# Patient Record
Sex: Male | Born: 1985 | Race: White | Hispanic: No | Marital: Married | Smoking: Never smoker
Health system: Southern US, Community
[De-identification: ages and names within clinical notes are randomized; demographics above are authoritative.]

## PROBLEM LIST (undated history)

## (undated) DIAGNOSIS — Z801 Family history of malignant neoplasm of trachea, bronchus and lung: Secondary | ICD-10-CM

## (undated) HISTORY — PX: WISDOM TOOTH EXTRACTION: SHX21

## (undated) HISTORY — DX: Family history of malignant neoplasm of trachea, bronchus and lung: Z80.1

---

## 2019-11-27 ENCOUNTER — Ambulatory Visit: Payer: Self-pay | Attending: Internal Medicine

## 2019-11-27 DIAGNOSIS — Z23 Encounter for immunization: Secondary | ICD-10-CM | POA: Insufficient documentation

## 2019-11-27 NOTE — Progress Notes (Signed)
   Covid-19 Vaccination Clinic  Name:  Henry Wall    MRN: [id number] DOB: 1985-12-24  11/27/2019  [name] was observed post Covid-19 immunization for 15 minutes without incident. He was provided with Vaccine Information Sheet and instruction to access the V-Safe system.   [name] was instructed to call 911 with any severe reactions post vaccine: Marland Kitchen Difficulty breathing  . Swelling of face and throat  . A fast heartbeat  . A bad rash all over body  . Dizziness and weakness

## 2019-12-29 ENCOUNTER — Ambulatory Visit: Payer: Self-pay | Attending: Internal Medicine

## 2019-12-29 DIAGNOSIS — Z23 Encounter for immunization: Secondary | ICD-10-CM

## 2019-12-29 NOTE — Progress Notes (Signed)
   Covid-19 Vaccination Clinic  Name:  Henry Wall    MRN: [id number] DOB: [birth year]  12/29/2019  [name] was observed post Covid-19 immunization for 15 minutes without incident. He was provided with Vaccine Information Sheet and instruction to access the V-Safe system.   [name] was instructed to call 911 with any severe reactions post vaccine: Marland Kitchen Difficulty breathing  . Swelling of face and throat  . A fast heartbeat  . A bad rash all over body  . Dizziness and weakness   Immunizations Administered    Name Date Dose VIS Date Route   Pfizer COVID-19 Vaccine 12/29/2019  9:55 AM 0.3 mL 09/04/2019 Intramuscular   Manufacturer: [employer]   Lot: [id number]   Blunt: [id number]

## 2020-10-18 ENCOUNTER — Ambulatory Visit: Payer: Federal, State, Local not specified - PPO | Admitting: Internal Medicine

## 2020-10-18 ENCOUNTER — Encounter: Payer: Self-pay | Admitting: Internal Medicine

## 2020-10-18 ENCOUNTER — Other Ambulatory Visit: Payer: Self-pay

## 2020-10-18 VITALS — BP 116/76 | HR 70 | Temp 98.9°F | Resp 16 | Ht 69.0 in | Wt 169.0 lb

## 2020-10-18 DIAGNOSIS — E559 Vitamin D deficiency, unspecified: Secondary | ICD-10-CM | POA: Diagnosis not present

## 2020-10-18 DIAGNOSIS — R5383 Other fatigue: Secondary | ICD-10-CM

## 2020-10-18 DIAGNOSIS — Z23 Encounter for immunization: Secondary | ICD-10-CM | POA: Diagnosis not present

## 2020-10-18 DIAGNOSIS — Z0000 Encounter for general adult medical examination without abnormal findings: Secondary | ICD-10-CM

## 2020-10-18 LAB — CBC WITH DIFFERENTIAL/PLATELET
Basophils Absolute: 0 10*3/uL (ref 0.0–0.1)
Basophils Relative: 0.9 % (ref 0.0–3.0)
Eosinophils Absolute: 0.1 10*3/uL (ref 0.0–0.7)
Eosinophils Relative: 1.4 % (ref 0.0–5.0)
HCT: 43.2 % (ref 39.0–52.0)
Hemoglobin: 14.7 g/dL (ref 13.0–17.0)
Lymphocytes Relative: 30.1 % (ref 12.0–46.0)
Lymphs Abs: 1.3 10*3/uL (ref 0.7–4.0)
MCHC: 34 g/dL (ref 30.0–36.0)
MCV: 94.7 fl (ref 78.0–100.0)
Monocytes Absolute: 0.3 10*3/uL (ref 0.1–1.0)
Monocytes Relative: 7.1 % (ref 3.0–12.0)
Neutro Abs: 2.6 10*3/uL (ref 1.4–7.7)
Neutrophils Relative %: 60.5 % (ref 43.0–77.0)
Platelets: 159 10*3/uL (ref 150.0–400.0)
RBC: 4.56 Mil/uL (ref 4.22–5.81)
RDW: 13.2 % (ref 11.5–15.5)
WBC: 4.3 10*3/uL (ref 4.0–10.5)

## 2020-10-18 LAB — HEPATIC FUNCTION PANEL
ALT: 14 U/L (ref 0–53)
AST: 17 U/L (ref 0–37)
Albumin: 4.9 g/dL (ref 3.5–5.2)
Alkaline Phosphatase: 53 U/L (ref 39–117)
Bilirubin, Direct: 0.2 mg/dL (ref 0.0–0.3)
Total Bilirubin: 1.2 mg/dL (ref 0.2–1.2)
Total Protein: 7.1 g/dL (ref 6.0–8.3)

## 2020-10-18 LAB — BASIC METABOLIC PANEL
BUN: 10 mg/dL (ref 6–23)
CO2: 29 mEq/L (ref 19–32)
Calcium: 9.5 mg/dL (ref 8.4–10.5)
Chloride: 105 mEq/L (ref 96–112)
Creatinine, Ser: 0.89 mg/dL (ref 0.40–1.50)
GFR: 110.37 mL/min (ref >60.00)
Glucose, Bld: 85 mg/dL (ref 70–99)
Potassium: 3.9 mEq/L (ref 3.5–5.1)
Sodium: 139 mEq/L (ref 135–145)

## 2020-10-18 LAB — TSH: TSH: 1.28 u[IU]/mL (ref 0.35–4.50)

## 2020-10-18 LAB — LIPID PANEL
Cholesterol: 176 mg/dL (ref 0–200)
HDL: 74.8 mg/dL (ref >39.00)
LDL Cholesterol: 93 mg/dL (ref 0–99)
NonHDL: 101.52
Total CHOL/HDL Ratio: 2
Triglycerides: 45 mg/dL (ref 0.0–149.0)
VLDL: 9 mg/dL (ref 0.0–40.0)

## 2020-10-18 LAB — VITAMIN D 25 HYDROXY (VIT D DEFICIENCY, FRACTURES): VITD: 16.15 ng/mL — ABNORMAL LOW (ref 30.00–100.00)

## 2020-10-18 MED ORDER — CHOLECALCIFEROL 1.25 MG (50000 UT) PO CAPS
50000.0000 [IU] | ORAL_CAPSULE | ORAL | 1 refills | Status: AC
Start: 2020-10-18 — End: ?

## 2020-10-18 NOTE — Patient Instructions (Signed)

## 2020-10-18 NOTE — Progress Notes (Signed)
Subjective:  Patient ID: Henry Wall, male    DOB: 1986-06-03  Age: 35 y.o. MRN: [id number]  CC: Annual Exam  This visit occurred during the SARS-CoV-2 public health emergency.  Safety protocols were in place, including screening questions prior to the visit, additional usage of staff PPE, and extensive cleaning of exam room while observing appropriate contact time as indicated for disinfecting solutions.   NEW TO ME  HPI Henry Wall presents for a CPX.  He complains of a 55-month history of fatigue that occurs only at rest.  He exercises quite a bit and does not experience CP, DOE, palpitations, edema, or fatigue.  He has mild insomnia but takes melatonin and gets 6 to 7 hours of sleep per night.  There is no history of snoring or sleep apnea.  History Henry Wall has no past medical history on file.   He has a past surgical history that includes Wisdom tooth extraction.   His family history includes Atrial fibrillation in his father; Depression in his sister; Hypertension in his mother; Schizophrenia in his brother.He reports that he has never smoked. He has never used smokeless tobacco. He reports current alcohol use of about 12.0 standard drinks of alcohol per week. He reports that he does not use drugs.  No outpatient medications prior to visit.   No facility-administered medications prior to visit.    ROS Review of Systems  Constitutional: Positive for fatigue. Negative for appetite change, chills, diaphoresis, fever and unexpected weight change.  Eyes: Negative.   Respiratory: Negative for apnea, cough, chest tightness, shortness of breath and wheezing.   Cardiovascular: Negative for chest pain, palpitations and leg swelling.  Gastrointestinal: Negative for abdominal pain, constipation, diarrhea, nausea and vomiting.  Endocrine: Negative.  Negative for cold intolerance and heat intolerance.  Genitourinary: Negative.  Negative for difficulty urinating, dysuria, scrotal swelling and  testicular pain.  Musculoskeletal: Negative for arthralgias, joint swelling and myalgias.  Skin: Negative.  Negative for color change.  Neurological: Negative.  Negative for dizziness, weakness, light-headedness and headaches.  Hematological: Negative for adenopathy. Does not bruise/bleed easily.  Psychiatric/Behavioral: Negative.     Objective:  BP 116/76   Pulse 70   Temp 98.9 F (37.2 C) (Oral)   Resp 16   Ht 5\' 9"  (1.753 m)   Wt 169 lb (76.7 kg)   SpO2 97%   BMI 24.96 kg/m   Physical Exam Vitals reviewed.  Constitutional:      Appearance: Normal appearance.  HENT:     Nose: Nose normal.     Mouth/Throat:     Mouth: Mucous membranes are moist.  Eyes:     General: No scleral icterus.    Conjunctiva/sclera: Conjunctivae normal.  Cardiovascular:     Rate and Rhythm: Normal rate and regular rhythm.     Heart sounds: No murmur heard.     Comments: EKG- NSR, 64 Normal EKG Pulmonary:     Effort: Pulmonary effort is normal.     Breath sounds: No stridor. No wheezing, rhonchi or rales.  Abdominal:     General: Abdomen is flat. Bowel sounds are normal. There is no distension.     Palpations: Abdomen is soft. There is no hepatomegaly, splenomegaly or mass.     Tenderness: There is no abdominal tenderness.  Musculoskeletal:        General: Normal range of motion.     Cervical back: Neck supple.     Right lower leg: No edema.     Left lower leg:  No edema.  Lymphadenopathy:     Cervical: No cervical adenopathy.  Skin:    General: Skin is warm and dry.  Neurological:     General: No focal deficit present.     Mental Status: He is alert.  Psychiatric:        Mood and Affect: Mood normal.       Assessment & Plan:   Henry Wall was seen today for annual exam.  Diagnoses and all orders for this visit:  Routine general medical examination at a health care facility- Exam completed, labs reviewed, vaccines reviewed and updated, patient education was given. -     Lipid  panel; Future -     Hepatitis C antibody; Future -     HIV Antibody (routine testing w rflx); Future -     HIV Antibody (routine testing w rflx) -     Hepatitis C antibody -     Lipid panel  Fatigue, unspecified type- His work-up is remarkable only for vitamin D deficiency.  I do not see any other causes for fatigue.  I offered reassurance.  He will let me know if he develops any new or worsening symptoms. -     CBC with Differential/Platelet; Future -     Basic metabolic panel; Future -     Hepatic function panel; Future -     TSH; Future -     VITAMIN D 25 Hydroxy (Vit-D Deficiency, Fractures); Future -     EKG 12-Lead -     VITAMIN D 25 Hydroxy (Vit-D Deficiency, Fractures) -     TSH -     Hepatic function panel -     Basic metabolic panel -     CBC with Differential/Platelet  Vitamin D deficiency -     Cholecalciferol 1.25 MG (50000 UT) capsule; Take 1 capsule (50,000 Units total) by mouth once a week.  Other orders -     Flu Vaccine QUAD 6+ mos PF IM (Fluarix Quad PF)   I am having Henry Wall start on Cholecalciferol.  Meds ordered this encounter  Medications  . Cholecalciferol 1.25 MG (50000 UT) capsule    Sig: Take 1 capsule (50,000 Units total) by mouth once a week.    Dispense:  12 capsule    Refill:  1     Follow-up: Return in about 3 months (around 01/16/2021).  Sanda Linger, MD

## 2020-10-19 LAB — HEPATITIS C ANTIBODY
Hepatitis C Ab: NONREACTIVE
SIGNAL TO CUT-OFF: 0.01 (ref <1.00)

## 2020-10-19 LAB — HIV ANTIBODY (ROUTINE TESTING W REFLEX): HIV 1&2 Ab, 4th Generation: NONREACTIVE

## 2020-10-20 ENCOUNTER — Encounter: Payer: Self-pay | Admitting: Internal Medicine

## 2020-10-24 DIAGNOSIS — F4321 Adjustment disorder with depressed mood: Secondary | ICD-10-CM | POA: Diagnosis not present

## 2020-11-21 DIAGNOSIS — F4323 Adjustment disorder with mixed anxiety and depressed mood: Secondary | ICD-10-CM | POA: Diagnosis not present

## 2020-11-24 ENCOUNTER — Emergency Department (HOSPITAL_COMMUNITY): Payer: Federal, State, Local not specified - PPO

## 2020-11-24 ENCOUNTER — Other Ambulatory Visit: Payer: Self-pay

## 2020-11-24 ENCOUNTER — Encounter (HOSPITAL_COMMUNITY): Payer: Self-pay

## 2020-11-24 ENCOUNTER — Emergency Department (HOSPITAL_COMMUNITY)
Admission: EM | Admit: 2020-11-24 | Discharge: 2020-11-24 | Disposition: A | Discharge status: Home / Self Care | Payer: Federal, State, Local not specified - PPO | Source: Home / Self Care | Attending: Emergency Medicine | Admitting: Emergency Medicine

## 2020-11-24 DIAGNOSIS — K921 Melena: Secondary | ICD-10-CM

## 2020-11-24 DIAGNOSIS — C189 Malignant neoplasm of colon, unspecified: Secondary | ICD-10-CM | POA: Diagnosis not present

## 2020-11-24 DIAGNOSIS — K625 Hemorrhage of anus and rectum: Secondary | ICD-10-CM | POA: Insufficient documentation

## 2020-11-24 DIAGNOSIS — K922 Gastrointestinal hemorrhage, unspecified: Secondary | ICD-10-CM | POA: Diagnosis not present

## 2020-11-24 DIAGNOSIS — Z8249 Family history of ischemic heart disease and other diseases of the circulatory system: Secondary | ICD-10-CM | POA: Diagnosis not present

## 2020-11-24 DIAGNOSIS — Z818 Family history of other mental and behavioral disorders: Secondary | ICD-10-CM | POA: Diagnosis not present

## 2020-11-24 DIAGNOSIS — Z79899 Other long term (current) drug therapy: Secondary | ICD-10-CM | POA: Diagnosis not present

## 2020-11-24 DIAGNOSIS — D62 Acute posthemorrhagic anemia: Secondary | ICD-10-CM | POA: Diagnosis not present

## 2020-11-24 DIAGNOSIS — Z20822 Contact with and (suspected) exposure to covid-19: Secondary | ICD-10-CM | POA: Diagnosis not present

## 2020-11-24 LAB — CBC
HCT: 43.4 % (ref 39.0–52.0)
Hemoglobin: 14.7 g/dL (ref 13.0–17.0)
MCH: 32.2 pg (ref 26.0–34.0)
MCHC: 33.9 g/dL (ref 30.0–36.0)
MCV: 95 fL (ref 80.0–100.0)
Platelets: 189 10*3/uL (ref 150–400)
RBC: 4.57 MIL/uL (ref 4.22–5.81)
RDW: 11.7 % (ref 11.5–15.5)
WBC: 9 10*3/uL (ref 4.0–10.5)
nRBC: 0 % (ref 0.0–0.2)

## 2020-11-24 LAB — COMPREHENSIVE METABOLIC PANEL
ALT: 18 U/L (ref 0–44)
AST: 21 U/L (ref 15–41)
Albumin: 4.8 g/dL (ref 3.5–5.0)
Alkaline Phosphatase: 48 U/L (ref 38–126)
Anion gap: 7 (ref 5–15)
BUN: 11 mg/dL (ref 6–20)
CO2: 28 mmol/L (ref 22–32)
Calcium: 9.3 mg/dL (ref 8.9–10.3)
Chloride: 104 mmol/L (ref 98–111)
Creatinine, Ser: 0.83 mg/dL (ref 0.61–1.24)
GFR, Estimated: >60 mL/min (ref >60)
Glucose, Bld: 142 mg/dL — ABNORMAL HIGH (ref 70–99)
Potassium: 3.3 mmol/L — ABNORMAL LOW (ref 3.5–5.1)
Sodium: 139 mmol/L (ref 135–145)
Total Bilirubin: 1.1 mg/dL (ref 0.3–1.2)
Total Protein: 7.4 g/dL (ref 6.5–8.1)

## 2020-11-24 LAB — URINALYSIS, ROUTINE W REFLEX MICROSCOPIC
Bilirubin Urine: NEGATIVE
Glucose, UA: NEGATIVE mg/dL
Hgb urine dipstick: NEGATIVE
Ketones, ur: NEGATIVE mg/dL
Leukocytes,Ua: NEGATIVE
Nitrite: NEGATIVE
Protein, ur: NEGATIVE mg/dL
Specific Gravity, Urine: 1.014 (ref 1.005–1.030)
pH: 8 (ref 5.0–8.0)

## 2020-11-24 LAB — TYPE AND SCREEN
ABO/RH(D): O POS
Antibody Screen: NEGATIVE

## 2020-11-24 LAB — POC OCCULT BLOOD, ED: Fecal Occult Bld: POSITIVE — AB

## 2020-11-24 LAB — ABO/RH: ABO/RH(D): O POS

## 2020-11-24 MED ORDER — IOHEXOL 300 MG/ML  SOLN
100.0000 mL | Freq: Once | INTRAMUSCULAR | Status: AC | PRN
  Administered 2020-11-24: 100 mL via INTRAVENOUS

## 2020-11-24 MED ORDER — POTASSIUM CHLORIDE CRYS ER 20 MEQ PO TBCR
40.0000 meq | EXTENDED_RELEASE_TABLET | Freq: Once | ORAL | Status: AC
  Administered 2020-11-24: 40 meq via ORAL
  Filled 2020-11-24: qty 2

## 2020-11-24 NOTE — ED Notes (Addendum)
POC occult result positive ( +)

## 2020-11-24 NOTE — ED Provider Notes (Signed)
[place] DEPT Provider Note   CSN: [id number] Arrival date & time: 11/24/20  1439     History Chief Complaint  Patient presents with  . Blood In Stools    Henry Wall is a 35 y.o. male with no significant past medical history presents to the ED due to rectal bleeding. Patient states he had a normal BM around 8AM this morning with no bleeding; however at [facility] patient states he felt the urge to have a BM which was all bright red blood both in the toilet bowl and when he wiped. Admits to a history of hemorrhoids.  Denies associated abdominal pain and rectal pain.  Denies melena and hematemesis.  No chronic NSAIDs.  Admits to drinking 2 vodka cocktails nightly with last alcoholic beverage last night.  Denies lightheadedness, chest pain, shortness of breath, and fatigue. Denies urinary symptoms, fever, chills, recent illness. No treatment prior to arrival.  No aggravating or alleviating symptoms. No previous colonoscopy. He is not currently on any blood thinners.  History obtained from patient and past medical records. No interpreter used during encounter.      History reviewed. No pertinent past medical history.  Patient Active Problem List   Diagnosis Date Noted  . Routine general medical examination at a health care facility 10/18/2020  . Fatigue 10/18/2020  . Vitamin D deficiency 10/18/2020    Past Surgical History:  Procedure Laterality Date  . WISDOM TOOTH EXTRACTION         Family History  Problem Relation Age of Onset  . Hypertension Mother   . Atrial fibrillation Father   . Schizophrenia Brother   . Depression Sister     Social History   Tobacco Use  . Smoking status: Never Smoker  . Smokeless tobacco: Never Used  Vaping Use  . Vaping Use: Never used  Substance Use Topics  . Alcohol use: Yes    Alcohol/week: 12.0 standard drinks    Types: 12 Shots of liquor per week  . Drug use: Never    Home Medications Prior to Admission  medications   Medication Sig Start Date End Date Taking? Authorizing Provider  Cholecalciferol 1.25 MG (50000 UT) capsule Take 1 capsule (50,000 Units total) by mouth once a week. 10/18/20  Yes Janith Lima, MD  Melatonin 5 MG CHEW Chew 10 mg by mouth at bedtime.   Yes [provider]    Allergies    Patient has no known allergies.  Review of Systems   Review of Systems  Constitutional: Negative for chills and fever.  Respiratory: Negative for shortness of breath.   Cardiovascular: Negative for chest pain.  Gastrointestinal: Positive for blood in stool. Negative for abdominal pain, nausea and vomiting.  Genitourinary: Negative for dysuria.  All other systems reviewed and are negative.   Physical Exam Updated Vital Signs BP 126/68   Pulse 81   Temp 98.7 F (37.1 C) (Oral)   Resp 18   Ht 5\' 9"  (1.753 m)   Wt 74.8 kg   SpO2 97%   BMI 24.37 kg/m   Physical Exam Vitals and nursing note reviewed.  Constitutional:      General: He is not in acute distress. HENT:     Head: Normocephalic.  Eyes:     Pupils: Pupils are equal, round, and reactive to light.  Cardiovascular:     Rate and Rhythm: Normal rate and regular rhythm.     Pulses: Normal pulses.     Heart sounds: Normal heart  sounds. No murmur heard. No friction rub. No gallop.   Pulmonary:     Effort: Pulmonary effort is normal.     Breath sounds: Normal breath sounds.  Abdominal:     General: Abdomen is flat. Bowel sounds are normal. There is no distension.     Palpations: Abdomen is soft.     Tenderness: There is no abdominal tenderness. There is no right CVA tenderness, left CVA tenderness, guarding or rebound.     Comments: Abdomen soft, nondistended, nontender to palpation in all quadrants without guarding or peritoneal signs. No rebound.   Genitourinary:    Comments: Rectal exam performed with chaperone. No external hemorrhoids or fissures visible.  Bright red blood per rectum.  Musculoskeletal:         General: Normal range of motion.     Cervical back: Neck supple.  Skin:    General: Skin is warm and dry.  Neurological:     General: No focal deficit present.  Psychiatric:        Mood and Affect: Mood normal.        Behavior: Behavior normal.     ED Results / Procedures / Treatments   Labs (all labs ordered are listed, but only abnormal results are displayed) Labs Reviewed  COMPREHENSIVE METABOLIC PANEL - Abnormal; Notable for the following components:      Result Value   Potassium 3.3 (*)    Glucose, Bld 142 (*)    All other components within normal limits  URINALYSIS, ROUTINE W REFLEX MICROSCOPIC - Abnormal; Notable for the following components:   Color, Urine STRAW (*)    All other components within normal limits  POC OCCULT BLOOD, ED - Abnormal; Notable for the following components:   Fecal Occult Bld POSITIVE (*)    All other components within normal limits  CBC  TYPE AND SCREEN  ABO/RH    EKG None  Radiology CT ABDOMEN PELVIS W CONTRAST  Result Date: 11/24/2020 CLINICAL DATA:  Gastrointestinal bleeding with bright red blood in his stool this afternoon. EXAM: CT ABDOMEN AND PELVIS WITH CONTRAST TECHNIQUE: Multidetector CT imaging of the abdomen and pelvis was performed using the standard protocol following bolus administration of intravenous contrast. CONTRAST:  129mL OMNIPAQUE IOHEXOL 300 MG/ML  SOLN COMPARISON:  None. FINDINGS: Lower chest: Unremarkable. Hepatobiliary: No focal liver abnormality is seen. No gallstones, gallbladder wall thickening, or biliary dilatation. Pancreas: Unremarkable. No pancreatic ductal dilatation or surrounding inflammatory changes. Spleen: Normal in size without focal abnormality. Adrenals/Urinary Tract: Adrenal glands are unremarkable. Kidneys are normal, without renal calculi, focal lesion, or hydronephrosis. Bladder is unremarkable. Stomach/Bowel: Stomach is within normal limits. Appendix appears normal. No evidence of bowel wall  thickening, distention, or inflammatory changes. Vascular/Lymphatic: No significant vascular findings are present. No enlarged abdominal or pelvic lymph nodes. Reproductive: Prostate is unremarkable. Other: No abdominal wall hernia or abnormality. No abdominopelvic ascites. Musculoskeletal: Normal appearing bones. IMPRESSION: Normal examination. Electronically Signed   By: Claudie Revering M.D.   On: 11/24/2020 16:24    Procedures Procedures   Medications Ordered in ED Medications  iohexol (OMNIPAQUE) 300 MG/ML solution 100 mL (100 mLs Intravenous Contrast Given 11/24/20 1557)  potassium chloride SA (KLOR-CON) CR tablet 40 mEq (40 mEq Oral Given 11/24/20 1623)    ED Course  I have reviewed the triage vital signs and the nursing notes.  Pertinent labs & imaging results that were available during my care of the patient were reviewed by me and considered in my medical decision making (  see chart for details).  Clinical Course as of 11/24/20 1856  Thu Nov 24, 2020  1540 Fecal Occult Blood, POC(!): POSITIVE [CA]  1540 Potassium(!): 3.3 [CA]  1540 Glucose(!): 142 [CA]    Clinical Course User Index [CA] Suzy Bouchard, PA-C   MDM Rules/Calculators/A&P                         35 year old male presents to the ED due to bright red blood per rectum 1 hour prior to arrival.  History of hemorrhoids. He is not currently on any blood thinners.  Last bowel movement 8 AM this morning which was normal with no blood.  Denies associated abdominal pain.  No previous colonoscopy.  No hematemesis or melena.  No fever or chills.  Upon arrival, patient is afebrile, not tachycardic or hypoxic.  Patient in no acute distress and non-ill-appearing.  Physical exam reassuring.  Abdomen soft, nondistended, nontender.  Rectal exam performed with chaperone in room.  No external hemorrhoids or fissures visible.  Bright red blood per rectum. Routine labs ordered at triage.  Patient hemodynamically stable and nontoxic-appearing.  CT abdomen to rule out mass or other intraabdominal etiology.  CBC unremarkable no leukocytosis and normal hemoglobin.  Fecal occult positive.  CMP significant for hyperglycemia 142 with no anion gap.  Hypokalemia 3.2.  Potassium repleted here in the ED.  Normal renal function. CT abdomen personally reviewed which is negative for any acute abnormalities.  UA negative for hematuria or signs of infection.  Orthostatic vitals within normal limits.  Patient remained hemodynamically stable during his ED stay.  Patient able to ambulate in the ED without difficulty.  Patient discharged with GI number.  Instructed patient to call tomorrow to schedule appoint for further evaluation. Strict ED precautions discussed with patient. Patient states understanding and agrees to plan. Patient discharged home in no acute distress and stable vitals.  Discussed case with Dr. Dina Rich who agrees with assessment and plan.  Final Clinical Impression(s) / ED Diagnoses Final diagnoses:  Hematochezia    Rx / DC Orders ED Discharge Orders    None       Suzy Bouchard, PA-C 11/24/20 1906    Lorelle Gibbs, DO 11/24/20 2306

## 2020-11-24 NOTE — Discharge Instructions (Signed)
As discussed, all of your labs were reassuring today.  Your CT scan did not show any abnormalities.  I have included the number of the GI doctor.  Please call tomorrow to schedule an appointment for further evaluation.  Return to the ER if you develop lightheadedness, chest pain, shortness of breath, or worsening bleeding.

## 2020-11-24 NOTE — ED Triage Notes (Signed)
Patient lc/o baright red blood in his stool this afternoon. Patient denies any abdominal or rectal pain.

## 2020-11-25 ENCOUNTER — Inpatient Hospital Stay (HOSPITAL_COMMUNITY)
Admission: EM | Admit: 2020-11-25 | Discharge: 2020-12-02 | DRG: 330 | Disposition: A | Discharge status: Home / Self Care | Payer: Federal, State, Local not specified - PPO | Attending: Internal Medicine | Admitting: Internal Medicine

## 2020-11-25 ENCOUNTER — Encounter (HOSPITAL_COMMUNITY): Payer: Self-pay

## 2020-11-25 ENCOUNTER — Observation Stay (HOSPITAL_COMMUNITY): Payer: Federal, State, Local not specified - PPO

## 2020-11-25 DIAGNOSIS — C187 Malignant neoplasm of sigmoid colon: Secondary | ICD-10-CM | POA: Diagnosis not present

## 2020-11-25 DIAGNOSIS — Z8249 Family history of ischemic heart disease and other diseases of the circulatory system: Secondary | ICD-10-CM

## 2020-11-25 DIAGNOSIS — C189 Malignant neoplasm of colon, unspecified: Secondary | ICD-10-CM

## 2020-11-25 DIAGNOSIS — K5669 Other partial intestinal obstruction: Secondary | ICD-10-CM | POA: Diagnosis not present

## 2020-11-25 DIAGNOSIS — D62 Acute posthemorrhagic anemia: Secondary | ICD-10-CM | POA: Diagnosis present

## 2020-11-25 DIAGNOSIS — K625 Hemorrhage of anus and rectum: Secondary | ICD-10-CM

## 2020-11-25 DIAGNOSIS — K922 Gastrointestinal hemorrhage, unspecified: Secondary | ICD-10-CM

## 2020-11-25 DIAGNOSIS — K921 Melena: Principal | ICD-10-CM | POA: Diagnosis present

## 2020-11-25 DIAGNOSIS — Z79899 Other long term (current) drug therapy: Secondary | ICD-10-CM

## 2020-11-25 DIAGNOSIS — Z20822 Contact with and (suspected) exposure to covid-19: Secondary | ICD-10-CM | POA: Diagnosis present

## 2020-11-25 DIAGNOSIS — Z818 Family history of other mental and behavioral disorders: Secondary | ICD-10-CM

## 2020-11-25 LAB — CBC WITH DIFFERENTIAL/PLATELET
Abs Immature Granulocytes: 0.02 10*3/uL (ref 0.00–0.07)
Basophils Absolute: 0 10*3/uL (ref 0.0–0.1)
Basophils Relative: 0 %
Eosinophils Absolute: 0.1 10*3/uL (ref 0.0–0.5)
Eosinophils Relative: 1 %
HCT: 38.5 % — ABNORMAL LOW (ref 39.0–52.0)
Hemoglobin: 13.2 g/dL (ref 13.0–17.0)
Immature Granulocytes: 0 %
Lymphocytes Relative: 14 %
Lymphs Abs: 1.5 10*3/uL (ref 0.7–4.0)
MCH: 33 pg (ref 26.0–34.0)
MCHC: 34.3 g/dL (ref 30.0–36.0)
MCV: 96.3 fL (ref 80.0–100.0)
Monocytes Absolute: 0.6 10*3/uL (ref 0.1–1.0)
Monocytes Relative: 5 %
Neutro Abs: 8.7 10*3/uL — ABNORMAL HIGH (ref 1.7–7.7)
Neutrophils Relative %: 80 %
Platelets: 158 10*3/uL (ref 150–400)
RBC: 4 MIL/uL — ABNORMAL LOW (ref 4.22–5.81)
RDW: 11.8 % (ref 11.5–15.5)
WBC: 10.9 10*3/uL — ABNORMAL HIGH (ref 4.0–10.5)
nRBC: 0 % (ref 0.0–0.2)

## 2020-11-25 LAB — COMPREHENSIVE METABOLIC PANEL
ALT: 16 U/L (ref 0–44)
AST: 16 U/L (ref 15–41)
Albumin: 4.2 g/dL (ref 3.5–5.0)
Alkaline Phosphatase: 46 U/L (ref 38–126)
Anion gap: 11 (ref 5–15)
BUN: 13 mg/dL (ref 6–20)
CO2: 22 mmol/L (ref 22–32)
Calcium: 9.1 mg/dL (ref 8.9–10.3)
Chloride: 105 mmol/L (ref 98–111)
Creatinine, Ser: 0.92 mg/dL (ref 0.61–1.24)
GFR, Estimated: >60 mL/min (ref >60)
Glucose, Bld: 115 mg/dL — ABNORMAL HIGH (ref 70–99)
Potassium: 3.7 mmol/L (ref 3.5–5.1)
Sodium: 138 mmol/L (ref 135–145)
Total Bilirubin: 0.9 mg/dL (ref 0.3–1.2)
Total Protein: 6.6 g/dL (ref 6.5–8.1)

## 2020-11-25 LAB — APTT: aPTT: 29 seconds (ref 24–36)

## 2020-11-25 LAB — RESP PANEL BY RT-PCR (FLU A&B, COVID) ARPGX2
Influenza A by PCR: NEGATIVE
Influenza B by PCR: NEGATIVE
SARS Coronavirus 2 by RT PCR: NEGATIVE

## 2020-11-25 LAB — TYPE AND SCREEN
ABO/RH(D): O POS
Antibody Screen: NEGATIVE

## 2020-11-25 LAB — PROTIME-INR
INR: 1.1 (ref 0.8–1.2)
Prothrombin Time: 14 seconds (ref 11.4–15.2)

## 2020-11-25 LAB — HEMOGLOBIN AND HEMATOCRIT, BLOOD
HCT: 36.5 % — ABNORMAL LOW (ref 39.0–52.0)
Hemoglobin: 12.4 g/dL — ABNORMAL LOW (ref 13.0–17.0)

## 2020-11-25 MED ORDER — LACTATED RINGERS IV SOLN: INTRAVENOUS | Status: DC

## 2020-11-25 MED ORDER — BISACODYL 5 MG PO TBEC
10.0000 mg | DELAYED_RELEASE_TABLET | Freq: Once | ORAL | Status: AC
  Administered 2020-11-25: 10 mg via ORAL
  Filled 2020-11-25: qty 2

## 2020-11-25 MED ORDER — PEG-KCL-NACL-NASULF-NA ASC-C 100 G PO SOLR: 1.0000 | Freq: Once | ORAL | Status: DC

## 2020-11-25 MED ORDER — PEG-KCL-NACL-NASULF-NA ASC-C 100 G PO SOLR
0.5000 | Freq: Once | ORAL | Status: AC
  Administered 2020-11-25: 100 g via ORAL
  Filled 2020-11-25: qty 1

## 2020-11-25 MED ORDER — PEG-KCL-NACL-NASULF-NA ASC-C 100 G PO SOLR
0.5000 | Freq: Once | ORAL | Status: AC
  Administered 2020-11-25: 100 g via ORAL

## 2020-11-25 MED ORDER — SODIUM CHLORIDE 0.9 % IV SOLN: INTRAVENOUS | Status: DC

## 2020-11-25 MED ORDER — IOHEXOL 350 MG/ML SOLN
100.0000 mL | Freq: Once | INTRAVENOUS | Status: AC | PRN
  Administered 2020-11-25: 100 mL via INTRAVENOUS

## 2020-11-25 MED ORDER — ONDANSETRON HCL 4 MG/2ML IJ SOLN: 4.0000 mg | Freq: Four times a day (QID) | INTRAMUSCULAR | Status: DC | PRN

## 2020-11-25 MED ORDER — ACETAMINOPHEN 650 MG RE SUPP: 650.0000 mg | Freq: Four times a day (QID) | RECTAL | Status: DC | PRN

## 2020-11-25 MED ORDER — ACETAMINOPHEN 325 MG PO TABS: 650.0000 mg | ORAL_TABLET | Freq: Four times a day (QID) | ORAL | Status: DC | PRN

## 2020-11-25 MED ORDER — ONDANSETRON HCL 4 MG PO TABS: 4.0000 mg | ORAL_TABLET | Freq: Four times a day (QID) | ORAL | Status: DC | PRN

## 2020-11-25 NOTE — ED Notes (Signed)
Pt had an episode of bloody stool. Large amount noted. No clots noted

## 2020-11-25 NOTE — H&P (Signed)
History and Physical    Rober Skeels SWH:[id number] DOB: 1986/02/10 DOA: 11/25/2020  PCP: Janith Lima, MD  Patient coming from: Home  I have personally briefly reviewed patient's old medical records in [place]  Chief Complaint: BRBPR  HPI: Henry Wall is a 35 y.o. male with medical history significant of previously healthy.  Pt presents to ED with c/o blood in stool.  Seen here earlier today for same.  Work up was neg at that time.  HGB 14.7, pt discharged home.  Since then he has had further and more frequent bloody BMs described as bright red blood.  No N/V.  No abd pain.  Symptoms are constant, worsening.   ED Course: 2 more bloody BMs in ED.  Repeat HGB 13.2.   Review of Systems: As per HPI, otherwise all review of systems negative.  History reviewed. No pertinent past medical history.  Past Surgical History:  Procedure Laterality Date  . WISDOM TOOTH EXTRACTION       reports that he has never smoked. He has never used smokeless tobacco. He reports current alcohol use of about 12.0 standard drinks of alcohol per week. He reports that he does not use drugs.  No Known Allergies  Family History  Problem Relation Age of Onset  . Hypertension Mother   . Atrial fibrillation Father   . Schizophrenia Brother   . Depression Sister      Prior to Admission medications   Medication Sig Start Date End Date Taking? Authorizing Provider  Cholecalciferol 1.25 MG (50000 UT) capsule Take 1 capsule (50,000 Units total) by mouth once a week. 10/18/20  Yes Janith Lima, MD  Melatonin 5 MG CHEW Chew 10 mg by mouth at bedtime.   Yes [provider]    Physical Exam: Vitals:   11/25/20 0154 11/25/20 0230 11/25/20 0300 11/25/20 0330  BP:  133/78 135/77 124/68  Pulse:  66 71 79  Resp:  18 18 18   Temp:      TempSrc:      SpO2: 99% 98% 99% 97%    Constitutional: NAD, calm, comfortable Eyes: PERRL, lids and conjunctivae normal ENMT: Mucous membranes are  moist. Posterior pharynx clear of any exudate or lesions.Normal dentition.  Neck: normal, supple, no masses, no thyromegaly Respiratory: clear to auscultation bilaterally, no wheezing, no crackles. Normal respiratory effort. No accessory muscle use.  Cardiovascular: Regular rate and rhythm, no murmurs / rubs / gallops. No extremity edema. 2+ pedal pulses. No carotid bruits.  Abdomen: no tenderness, no masses palpated. No hepatosplenomegaly. Bowel sounds positive.  Musculoskeletal: no clubbing / cyanosis. No joint deformity upper and lower extremities. Good ROM, no contractures. Normal muscle tone.  Skin: no rashes, lesions, ulcers. No induration Neurologic: CN 2-12 grossly intact. Sensation intact, DTR normal. Strength 5/5 in all 4.  Psychiatric: Normal judgment and insight. Alert and oriented x 3. Normal mood.    Labs on Admission: I have personally reviewed following labs and imaging studies  CBC: Recent Labs  Lab 11/24/20 1450 11/25/20 0209  WBC 9.0 10.9*  NEUTROABS  --  8.7*  HGB 14.7 13.2  HCT 43.4 38.5*  MCV 95.0 96.3  PLT 189 665   Basic Metabolic Panel: Recent Labs  Lab 11/24/20 1450 11/25/20 0209  NA 139 138  K 3.3* 3.7  CL 104 105  CO2 28 22  GLUCOSE 142* 115*  BUN 11 13  CREATININE 0.83 0.92  CALCIUM 9.3 9.1   GFR: Estimated Creatinine Clearance: 111 mL/min (by  C-G formula based on SCr of 0.92 mg/dL). Liver Function Tests: Recent Labs  Lab 11/24/20 1450 11/25/20 0209  AST 21 16  ALT 18 16  ALKPHOS 48 46  BILITOT 1.1 0.9  PROT 7.4 6.6  ALBUMIN 4.8 4.2   No results for input(s): LIPASE, AMYLASE in the last 168 hours. No results for input(s): AMMONIA in the last 168 hours. Coagulation Profile: Recent Labs  Lab 11/25/20 0209  INR 1.1   Cardiac Enzymes: No results for input(s): CKTOTAL, CKMB, CKMBINDEX, TROPONINI in the last 168 hours. BNP (last 3 results) No results for input(s): PROBNP in the last 8760 hours. HbA1C: No results for input(s):  HGBA1C in the last 72 hours. CBG: No results for input(s): GLUCAP in the last 168 hours. Lipid Profile: No results for input(s): CHOL, HDL, LDLCALC, TRIG, CHOLHDL, LDLDIRECT in the last 72 hours. Thyroid Function Tests: No results for input(s): TSH, T4TOTAL, FREET4, T3FREE, THYROIDAB in the last 72 hours. Anemia Panel: No results for input(s): VITAMINB12, FOLATE, FERRITIN, TIBC, IRON, RETICCTPCT in the last 72 hours. Urine analysis:    Component Value Date/Time   COLORURINE STRAW (A) 11/24/2020 1812   APPEARANCEUR CLEAR 11/24/2020 1812   LABSPEC 1.014 11/24/2020 1812   PHURINE 8.0 11/24/2020 1812   GLUCOSEU NEGATIVE 11/24/2020 1812   HGBUR NEGATIVE 11/24/2020 1812   BILIRUBINUR NEGATIVE 11/24/2020 1812   KETONESUR NEGATIVE 11/24/2020 1812   PROTEINUR NEGATIVE 11/24/2020 1812   NITRITE NEGATIVE 11/24/2020 1812   LEUKOCYTESUR NEGATIVE 11/24/2020 1812    Radiological Exams on Admission: CT ABDOMEN PELVIS W CONTRAST  Result Date: 11/24/2020 CLINICAL DATA:  Gastrointestinal bleeding with bright red blood in his stool this afternoon. EXAM: CT ABDOMEN AND PELVIS WITH CONTRAST TECHNIQUE: Multidetector CT imaging of the abdomen and pelvis was performed using the standard protocol following bolus administration of intravenous contrast. CONTRAST:  165mL OMNIPAQUE IOHEXOL 300 MG/ML  SOLN COMPARISON:  None. FINDINGS: Lower chest: Unremarkable. Hepatobiliary: No focal liver abnormality is seen. No gallstones, gallbladder wall thickening, or biliary dilatation. Pancreas: Unremarkable. No pancreatic ductal dilatation or surrounding inflammatory changes. Spleen: Normal in size without focal abnormality. Adrenals/Urinary Tract: Adrenal glands are unremarkable. Kidneys are normal, without renal calculi, focal lesion, or hydronephrosis. Bladder is unremarkable. Stomach/Bowel: Stomach is within normal limits. Appendix appears normal. No evidence of bowel wall thickening, distention, or inflammatory changes.  Vascular/Lymphatic: No significant vascular findings are present. No enlarged abdominal or pelvic lymph nodes. Reproductive: Prostate is unremarkable. Other: No abdominal wall hernia or abnormality. No abdominopelvic ascites. Musculoskeletal: Normal appearing bones. IMPRESSION: Normal examination. Electronically Signed   By: Claudie Revering M.D.   On: 11/24/2020 16:24    EKG: Independently reviewed.  Assessment/Plan Active Problems:   BRBPR (bright red blood per rectum)    1. BRBPR - 1. Getting CTA abd/pelvis to see if we can localize the active bleed 1. Call IR if positive 2. EDP put in message to Dr. Benson Norway for GI consult in AM 3. Clear liquid diet 4. Start LR at 125 cc/hr 5. Tele monitor 6. H/H Q6H 7. Type and screen done 8. Transfuse if needed.  DVT prophylaxis: SCDs Code Status: Full Family Communication: Family at bedside Disposition Plan: Home after admit Consults called: Message sent to Dr. Benson Norway for GI consult Admission status: Place in obs    Moises Terpstra, [place] [facility]  How to contact the [facility] Attending or Consulting provider [place] or covering provider during after hours [place], for this patient?  1. Check the  care team in [facility] and look for a) attending/consulting [place] provider listed and b) the [facility] team listed 2. Log into [email or web address]  Amion Physician Scheduling and messaging for groups and whole hospitals  On call and physician scheduling software for group practices, residents, hospitalists and other medical providers for call, clinic, rotation and shift schedules. On[employer] is a hospital-wide system for scheduling doctors and paging doctors on call. EasyPlot is for scientific plotting and data analysis.  [email or web address]  and use [place]'s universal password to access. If you do not have the password, please contact the hospital operator.  3. Locate the [facility] provider you are looking for under [facility] and page to a number that you can be  directly reached. 4. If you still have difficulty reaching the provider, please page the [facility] (Director on Call) for the Hospitalists listed on amion for assistance.  11/25/2020, 4:00 AM

## 2020-11-25 NOTE — H&P (View-Only) (Signed)
Referring Provider: Dr. Gardenia Phlegm Primary Care Physician:  Janith Lima, MD Primary Gastroenterologist:  Althia Forts   Reason for Consultation:  Lower GI bleed   HPI: Henry Wall is a 35 y.o. male with no significant past medical history.  He passed a normal bowel movement yesterday moring without associated bleeding.  Later in the afternoon around 2pm, he felt an urge to have a bowel movement and he passed a moderate amount of bright red blood into the toilet bowl with a small amount of formed stool. No associated abdominal or rectal pain.  No NSAID use.  No anticoagulant use.  He presented to the ED 11/24/2020 for further evaluation.  Labs in the ED showed a Hemoglobin of 14.7 ( base line Hg 14.7 on 10/18/2020).  Hematocrit 43.4.  Platelet 189.  BUN 11.  Creatinine 0.83.  Sodium 139.  Potassium 3.3.  Normal total bili and LFTs.  FOBT positive.  SARS coronavirus 2 negative.  Influenza a and B negative.  An abdominal/pelvic CT scan was negative.  He remained hemodynamically stable and he was discharged home with instructions to schedule a GI evaluation.  He returned home around 8 PM and around 9 PM he passed a large amount of bright red blood per the rectum without passing any stool.  At midnight, he passed a similar large volume of bright red blood per the rectum.  No associated abdominal or rectal pain.  No chest pain, palpitations or dizziness.  He presented back to the ED early this morning for re-evaluation.  In the ED, a repeat Hemoglobin level was 13.2 down from 14.7.  An abdominal/pelvic CT angiogram was negative, no evidence of active GI bleeding.  While in the ED, he passed 2 more episodes of hematochezia which he stated was bright red and darker red. He has a history of hemorrhoids without prior rectal bleeding.  No constipation or straining.  No NSAID use.  No family history of colorectal cancer.  He drinks 2 vodka cocktails daily.  No drug use.  No fever, sweats or chills.  No weight loss.   His wife is at the bedside.   History reviewed. No pertinent past medical history.  Past Surgical History:  Procedure Laterality Date  . WISDOM TOOTH EXTRACTION      Prior to Admission medications   Medication Sig Start Date End Date Taking? Authorizing Provider  Cholecalciferol 1.25 MG (50000 UT) capsule Take 1 capsule (50,000 Units total) by mouth once a week. 10/18/20  Yes Janith Lima, MD  Melatonin 5 MG CHEW Chew 10 mg by mouth at bedtime.   Yes [provider]    Current Facility-Administered Medications  Medication Dose Route Frequency Provider Last Rate Last Admin  . 0.9 %  sodium chloride infusion   Intravenous Continuous Lacretia Leigh, MD   Stopped at 11/25/20 [phone]  . acetaminophen (TYLENOL) tablet 650 mg  650 mg Oral Q6H PRN Etta Quill, DO       Or  . acetaminophen (TYLENOL) suppository 650 mg  650 mg Rectal Q6H PRN Etta Quill, DO      . lactated ringers infusion   Intravenous Continuous Etta Quill, DO 125 mL/hr at 11/25/20 0527 New Bag at 11/25/20 0527  . ondansetron (ZOFRAN) tablet 4 mg  4 mg Oral Q6H PRN Etta Quill, DO       Or  . ondansetron [facility]) injection 4 mg  4 mg Intravenous Q6H PRN Etta Quill, DO  Allergies as of 11/25/2020  . (No Known Allergies)    Family History  Problem Relation Age of Onset  . Hypertension Mother   . Atrial fibrillation Father   . Schizophrenia Brother   . Depression Sister     Social History   Socioeconomic History  . Marital status: Married    Spouse name: Not on file  . Number of children: Not on file  . Years of education: Not on file  . Highest education level: Not on file  Occupational History  . Not on file  Tobacco Use  . Smoking status: Never Smoker  . Smokeless tobacco: Never Used  Vaping Use  . Vaping Use: Never used  Substance and Sexual Activity  . Alcohol use: Yes    Alcohol/week: 12.0 standard drinks    Types: 12 Shots of liquor per week  . Drug use:  Never  . Sexual activity: Yes    Partners: Female  Other Topics Concern  . Not on file  Social History Narrative  . Not on file   Social Determinants of Health   Financial Resource Strain: Not on file  Food Insecurity: Not on file  Transportation Needs: Not on file  Physical Activity: Not on file  Stress: Not on file  Social Connections: Not on file  Intimate Partner Violence: Not on file    Review of Systems: Gen: Denies fever, sweats or chills. No weight loss.  CV: Denies chest pain, palpitations or edema. Resp: Denies cough, shortness of breath of hemoptysis.  GI: Denies heartburn, dysphagia, stomach or lower abdominal pain.  See HPI. GU : Denies urinary burning, blood in urine, increased urinary frequency or incontinence. MS: Denies joint pain, muscles aches or weakness. Derm: Denies rash, itchiness, skin lesions or unhealing ulcers. Psych: Denies depression, anxiety or memory loss. Heme: Denies easy bruising, bleeding. Neuro:  Denies headaches, dizziness or paresthesias. Endo:  Denies any problems with DM, thyroid or adrenal function.  Physical Exam: Vital signs in last 24 hours: Temp:  [98.5 F (36.9 C)-98.8 F (37.1 C)] 98.5 F (36.9 C) (03/04 0508) Pulse Rate:  [66-94] 66 (03/04 0508) Resp:  [16-18] 18 (03/04 0508) BP: (115-154)/(61-84) 115/65 (03/04 0508) SpO2:  [96 %-100 %] 96 % (03/04 0508) Weight:  [74.8 kg] 74.8 kg (03/03 1445) Last BM Date: 11/24/20 General:  Alert,  well-developed, well-nourished, pleasant and cooperative in NAD. Head:  Normocephalic and atraumatic. Eyes:  No scleral icterus. Conjunctiva pink. Ears:  Normal auditory acuity. Nose:  No deformity, discharge or lesions. Mouth:  Dentition intact. No ulcers or lesions.  Neck:  Supple. No lymphadenopathy or thyromegaly.  Lungs: Breath sounds clear throughout. Heart: Regular rate and rhythm, no murmurs. Abdomen: Soft, nontender, no masses or organomegaly. Rectal: No external hemorrhoids or  fissures.  Anal hemorrhoids without prolapse.  Scant amount of bright red blood on the exam glove.  No mass.  RN present during exam. Musculoskeletal:  Symmetrical without gross deformities.  Pulses:  Normal pulses noted. Extremities:  Without clubbing or edema. Neurologic:  Alert and  oriented x4. No focal deficits.  Skin:  Intact without significant lesions or rashes. Psych:  Alert and cooperative. Normal mood and affect.  Intake/Output from previous day: No intake/output data recorded. Intake/Output this shift: No intake/output data recorded.  Lab Results: Recent Labs    11/24/20 1450 11/25/20 0209  WBC 9.0 10.9*  HGB 14.7 13.2  HCT 43.4 38.5*  PLT 189 158   BMET Recent Labs    11/24/20 1450 11/25/20  0209  NA 139 138  K 3.3* 3.7  CL 104 105  CO2 28 22  GLUCOSE 142* 115*  BUN 11 13  CREATININE 0.83 0.92  CALCIUM 9.3 9.1   LFT Recent Labs    11/25/20 0209  PROT 6.6  ALBUMIN 4.2  AST 16  ALT 16  ALKPHOS 46  BILITOT 0.9   PT/INR Recent Labs    11/25/20 0209  LABPROT 14.0  INR 1.1   Hepatitis Panel No results for input(s): HEPBSAG, HCVAB, HEPAIGM, HEPBIGM in the last 72 hours.    Studies/Results: CT ABDOMEN PELVIS W CONTRAST  Result Date: 11/24/2020 CLINICAL DATA:  Gastrointestinal bleeding with bright red blood in his stool this afternoon. EXAM: CT ABDOMEN AND PELVIS WITH CONTRAST TECHNIQUE: Multidetector CT imaging of the abdomen and pelvis was performed using the standard protocol following bolus administration of intravenous contrast. CONTRAST:  100mL OMNIPAQUE IOHEXOL 300 MG/ML  SOLN COMPARISON:  None. FINDINGS: Lower chest: Unremarkable. Hepatobiliary: No focal liver abnormality is seen. No gallstones, gallbladder wall thickening, or biliary dilatation. Pancreas: Unremarkable. No pancreatic ductal dilatation or surrounding inflammatory changes. Spleen: Normal in size without focal abnormality. Adrenals/Urinary Tract: Adrenal glands are unremarkable.  Kidneys are normal, without renal calculi, focal lesion, or hydronephrosis. Bladder is unremarkable. Stomach/Bowel: Stomach is within normal limits. Appendix appears normal. No evidence of bowel wall thickening, distention, or inflammatory changes. Vascular/Lymphatic: No significant vascular findings are present. No enlarged abdominal or pelvic lymph nodes. Reproductive: Prostate is unremarkable. Other: No abdominal wall hernia or abnormality. No abdominopelvic ascites. Musculoskeletal: Normal appearing bones. IMPRESSION: Normal examination. Electronically Signed   By: Steven  Reid M.D.   On: 11/24/2020 16:24   CT Angio Abd/Pel w/ and/or w/o  Result Date: 11/25/2020 CLINICAL DATA:  Recent GI bleeding, history of hemorrhoids EXAM: CTA ABDOMEN AND PELVIS WITHOUT AND WITH CONTRAST TECHNIQUE: Multidetector CT imaging of the abdomen and pelvis was performed using the standard protocol during bolus administration of intravenous contrast. Multiplanar reconstructed images and MIPs were obtained and reviewed to evaluate the vascular anatomy. CONTRAST:  100mL OMNIPAQUE IOHEXOL 350 MG/ML SOLN COMPARISON:  CT from the previous day. FINDINGS: VASCULAR Aorta: Normal caliber aorta without aneurysm, dissection, vasculitis or significant stenosis. Celiac: Patent without evidence of aneurysm, dissection, vasculitis or significant stenosis. SMA: Patent without evidence of aneurysm, dissection, vasculitis or significant stenosis. Renals: Both renal arteries are patent without evidence of aneurysm, dissection, vasculitis, fibromuscular dysplasia or significant stenosis. IMA: Patent without evidence of aneurysm, dissection, vasculitis or significant stenosis. Iliacs: Patent without evidence of aneurysm, dissection, vasculitis or significant stenosis. Veins: No specific venous abnormality is noted. Review of the MIP images confirms the above findings. NON-VASCULAR Lower chest: No acute abnormality. Hepatobiliary: No focal liver  abnormality is seen. No gallstones, gallbladder wall thickening, or biliary dilatation. Pancreas: Unremarkable. No pancreatic ductal dilatation or surrounding inflammatory changes. Spleen: Normal in size without focal abnormality. Adrenals/Urinary Tract: Adrenal glands are unremarkable. Kidneys are normal, without renal calculi, focal lesion, or hydronephrosis. Bladder is unremarkable. Stomach/Bowel: No obstructive or inflammatory changes are noted within the colon. No areas of contrast extravasation are identified to suggest acute hemorrhage. Small bowel and stomach are within normal limits. Lymphatic: No enlarged abdominal or pelvic lymph nodes. Reproductive: Prostate is unremarkable. Other: No abdominal wall hernia or abnormality. No abdominopelvic ascites. Musculoskeletal: No acute or significant osseous findings. IMPRESSION: VASCULAR No acute vascular abnormality is noted. No findings to suggest active GI hemorrhage are seen. NON-VASCULAR No acute abnormality noted.  Unchanged from the previous day.   Electronically Signed   By: Inez Catalina M.D.   On: 11/25/2020 05:10    IMPRESSION/PLAN:  38.  35 year old male with painless hematochezia.  He was seen in the ED 11/24/2020 with bright red rectal bleeding.  Hemoglobin was 14.7 which is his baseline.  CTAP was negative. He was hemodynamically stable an ddischarged home with instructions to schedule GI follow-up.  However, he had recurrent hematochezia x 2 upon returning home and he presented back to the ED early this a.m.  Repeat hemoglobin 13.2.  CTAP 3/3 was unrevealing.  Abdominal/pelvic CT angiogram today without evidence of active GI bleeding.  He had 2 additional episodes of bright red and darker blood per the rectum while in the ED.  -Clear liquids  -N.p.o. after midnight -Most likely will have colonoscopy 11/26/2020, await further recommendations per Dr. Loletha Carrow  -Monitor H&H every 6 hours x 24 hours -Monitor patient closely for active GI  bleeding    Noralyn Pick  11/25/2020, 7:41 AM   I have reviewed the entire case in detail with the above APP and discussed the plan in detail.  Therefore, I agree with the diagnoses recorded above. In addition,  I have personally interviewed and examined the patient and have personally reviewed any abdominal/pelvic CT scan images.  My additional thoughts are as follows:  Painless hematochezia, decrease in hemoglobin was still in normal range.  He is hemodynamically stable and looks well.  Benign exam. It is behaving like diverticular or AVM bleeding, though he is younger than usual for that.  Plan is for colonoscopy tomorrow.  He was agreeable after discussion of procedure and risks, and his wife was present at the bedside for the entire visit.  The benefits and risks of the planned procedure were described in detail with the patient or (when appropriate) their health care proxy.  Risks were outlined as including, but not limited to, bleeding, infection, perforation, adverse medication reaction leading to cardiac or pulmonary decompensation, pancreatitis (if ERCP).  The limitation of incomplete mucosal visualization was also discussed.  No guarantees or warranties were given.  Serial hemoglobin and hematocrit, IV fluid support, bowel preparation tonight, and I will be on-call overnight if needed for questions or problems.  Discussed with the patient's nurse.   Nelida Meuse III [phone]

## 2020-11-25 NOTE — ED Notes (Signed)
Pt had another stool with large amount of bright red blood. Pt notified this nurse

## 2020-11-25 NOTE — Progress Notes (Signed)
   Patient seen and examined at bedside, patient admitted after midnight, please see earlier detailed admission note by Etta Quill, DO. Briefly, patient presented secondary to recurrent hematochezia with concern for GI bleeding.  Subjective: No recurrent hematochezia overnight.  BP 115/65   Pulse 66   Temp 98.5 F (36.9 C) (Oral)   Resp 18   SpO2 96%   General exam: Appears calm and comfortable  Respiratory system: Clear to auscultation. Respiratory effort normal. Cardiovascular system: S1 & S2 heard, RRR. No murmurs, rubs, gallops or clicks. Gastrointestinal system: Abdomen is nondistended, soft and nontender. No organomegaly or masses felt. Normal bowel sounds heard. Central nervous system: Alert and oriented. No focal neurological deficits. Musculoskeletal: No edema. No calf tenderness Skin: No cyanosis. No rashes Psychiatry: Judgement and insight appear normal. Mood & affect appropriate.   Brief assessment/Plan:  Bright red blood per rectum History of hemorrhoids. Large volume per patient. Hemoglobin stable. GI consulted and plan colonoscopy -Clear liquid diet -GI recommendations -CBC in AM  Family communication: Wife at bedside DVT prophylaxis: SCDs Disposition: Discharge home likely in 24 hours pending GI workup  Cordelia Poche, MD [facility] 11/25/2020, 7:44 AM

## 2020-11-25 NOTE — Consult Note (Addendum)
Referring Provider: Dr. Gardenia Phlegm Primary Care Physician:  Janith Lima, MD Primary Gastroenterologist:  Althia Forts   Reason for Consultation:  Lower GI bleed   HPI: Henry Wall is a 35 y.o. male with no significant past medical history.  He passed a normal bowel movement yesterday moring without associated bleeding.  Later in the afternoon around 2pm, he felt an urge to have a bowel movement and he passed a moderate amount of bright red blood into the toilet bowl with a small amount of formed stool. No associated abdominal or rectal pain.  No NSAID use.  No anticoagulant use.  He presented to the ED 11/24/2020 for further evaluation.  Labs in the ED showed a Hemoglobin of 14.7 ( base line Hg 14.7 on 10/18/2020).  Hematocrit 43.4.  Platelet 189.  BUN 11.  Creatinine 0.83.  Sodium 139.  Potassium 3.3.  Normal total bili and LFTs.  FOBT positive.  SARS coronavirus 2 negative.  Influenza a and B negative.  An abdominal/pelvic CT scan was negative.  He remained hemodynamically stable and he was discharged home with instructions to schedule a GI evaluation.  He returned home around 8 PM and around 9 PM he passed a large amount of bright red blood per the rectum without passing any stool.  At midnight, he passed a similar large volume of bright red blood per the rectum.  No associated abdominal or rectal pain.  No chest pain, palpitations or dizziness.  He presented back to the ED early this morning for re-evaluation.  In the ED, a repeat Hemoglobin level was 13.2 down from 14.7.  An abdominal/pelvic CT angiogram was negative, no evidence of active GI bleeding.  While in the ED, he passed 2 more episodes of hematochezia which he stated was bright red and darker red. He has a history of hemorrhoids without prior rectal bleeding.  No constipation or straining.  No NSAID use.  No family history of colorectal cancer.  He drinks 2 vodka cocktails daily.  No drug use.  No fever, sweats or chills.  No weight loss.   His wife is at the bedside.   History reviewed. No pertinent past medical history.  Past Surgical History:  Procedure Laterality Date  . WISDOM TOOTH EXTRACTION      Prior to Admission medications   Medication Sig Start Date End Date Taking? Authorizing Provider  Cholecalciferol 1.25 MG (50000 UT) capsule Take 1 capsule (50,000 Units total) by mouth once a week. 10/18/20  Yes Janith Lima, MD  Melatonin 5 MG CHEW Chew 10 mg by mouth at bedtime.   Yes [provider]    Current Facility-Administered Medications  Medication Dose Route Frequency Provider Last Rate Last Admin  . 0.9 %  sodium chloride infusion   Intravenous Continuous Lacretia Leigh, MD   Stopped at 11/25/20 [phone]  . acetaminophen (TYLENOL) tablet 650 mg  650 mg Oral Q6H PRN Etta Quill, DO       Or  . acetaminophen (TYLENOL) suppository 650 mg  650 mg Rectal Q6H PRN Etta Quill, DO      . lactated ringers infusion   Intravenous Continuous Etta Quill, DO 125 mL/hr at 11/25/20 0527 New Bag at 11/25/20 0527  . ondansetron (ZOFRAN) tablet 4 mg  4 mg Oral Q6H PRN Etta Quill, DO       Or  . ondansetron [facility]) injection 4 mg  4 mg Intravenous Q6H PRN Etta Quill, DO  Allergies as of 11/25/2020  . (No Known Allergies)    Family History  Problem Relation Age of Onset  . Hypertension Mother   . Atrial fibrillation Father   . Schizophrenia Brother   . Depression Sister     Social History   Socioeconomic History  . Marital status: Married    Spouse name: Not on file  . Number of children: Not on file  . Years of education: Not on file  . Highest education level: Not on file  Occupational History  . Not on file  Tobacco Use  . Smoking status: Never Smoker  . Smokeless tobacco: Never Used  Vaping Use  . Vaping Use: Never used  Substance and Sexual Activity  . Alcohol use: Yes    Alcohol/week: 12.0 standard drinks    Types: 12 Shots of liquor per week  . Drug use:  Never  . Sexual activity: Yes    Partners: Female  Other Topics Concern  . Not on file  Social History Narrative  . Not on file   Social Determinants of Health   Financial Resource Strain: Not on file  Food Insecurity: Not on file  Transportation Needs: Not on file  Physical Activity: Not on file  Stress: Not on file  Social Connections: Not on file  Intimate Partner Violence: Not on file    Review of Systems: Gen: Denies fever, sweats or chills. No weight loss.  CV: Denies chest pain, palpitations or edema. Resp: Denies cough, shortness of breath of hemoptysis.  GI: Denies heartburn, dysphagia, stomach or lower abdominal pain.  See HPI. GU : Denies urinary burning, blood in urine, increased urinary frequency or incontinence. MS: Denies joint pain, muscles aches or weakness. Derm: Denies rash, itchiness, skin lesions or unhealing ulcers. Psych: Denies depression, anxiety or memory loss. Heme: Denies easy bruising, bleeding. Neuro:  Denies headaches, dizziness or paresthesias. Endo:  Denies any problems with DM, thyroid or adrenal function.  Physical Exam: Vital signs in last 24 hours: Temp:  [98.5 F (36.9 C)-98.8 F (37.1 C)] 98.5 F (36.9 C) (03/04 0508) Pulse Rate:  [66-94] 66 (03/04 0508) Resp:  [16-18] 18 (03/04 0508) BP: (115-154)/(61-84) 115/65 (03/04 0508) SpO2:  [96 %-100 %] 96 % (03/04 0508) Weight:  [74.8 kg] 74.8 kg (03/03 1445) Last BM Date: 11/24/20 General:  Alert,  well-developed, well-nourished, pleasant and cooperative in NAD. Head:  Normocephalic and atraumatic. Eyes:  No scleral icterus. Conjunctiva pink. Ears:  Normal auditory acuity. Nose:  No deformity, discharge or lesions. Mouth:  Dentition intact. No ulcers or lesions.  Neck:  Supple. No lymphadenopathy or thyromegaly.  Lungs: Breath sounds clear throughout. Heart: Regular rate and rhythm, no murmurs. Abdomen: Soft, nontender, no masses or organomegaly. Rectal: No external hemorrhoids or  fissures.  Anal hemorrhoids without prolapse.  Scant amount of bright red blood on the exam glove.  No mass.  RN present during exam. Musculoskeletal:  Symmetrical without gross deformities.  Pulses:  Normal pulses noted. Extremities:  Without clubbing or edema. Neurologic:  Alert and  oriented x4. No focal deficits.  Skin:  Intact without significant lesions or rashes. Psych:  Alert and cooperative. Normal mood and affect.  Intake/Output from previous day: No intake/output data recorded. Intake/Output this shift: No intake/output data recorded.  Lab Results: Recent Labs    11/24/20 1450 11/25/20 0209  WBC 9.0 10.9*  HGB 14.7 13.2  HCT 43.4 38.5*  PLT 189 158   BMET Recent Labs    11/24/20 1450 11/25/20  0209  NA 139 138  K 3.3* 3.7  CL 104 105  CO2 28 22  GLUCOSE 142* 115*  BUN 11 13  CREATININE 0.83 0.92  CALCIUM 9.3 9.1   LFT Recent Labs    11/25/20 0209  PROT 6.6  ALBUMIN 4.2  AST 16  ALT 16  ALKPHOS 46  BILITOT 0.9   PT/INR Recent Labs    11/25/20 0209  LABPROT 14.0  INR 1.1   Hepatitis Panel No results for input(s): HEPBSAG, HCVAB, HEPAIGM, HEPBIGM in the last 72 hours.    Studies/Results: CT ABDOMEN PELVIS W CONTRAST  Result Date: 11/24/2020 CLINICAL DATA:  Gastrointestinal bleeding with bright red blood in his stool this afternoon. EXAM: CT ABDOMEN AND PELVIS WITH CONTRAST TECHNIQUE: Multidetector CT imaging of the abdomen and pelvis was performed using the standard protocol following bolus administration of intravenous contrast. CONTRAST:  171mL OMNIPAQUE IOHEXOL 300 MG/ML  SOLN COMPARISON:  None. FINDINGS: Lower chest: Unremarkable. Hepatobiliary: No focal liver abnormality is seen. No gallstones, gallbladder wall thickening, or biliary dilatation. Pancreas: Unremarkable. No pancreatic ductal dilatation or surrounding inflammatory changes. Spleen: Normal in size without focal abnormality. Adrenals/Urinary Tract: Adrenal glands are unremarkable.  Kidneys are normal, without renal calculi, focal lesion, or hydronephrosis. Bladder is unremarkable. Stomach/Bowel: Stomach is within normal limits. Appendix appears normal. No evidence of bowel wall thickening, distention, or inflammatory changes. Vascular/Lymphatic: No significant vascular findings are present. No enlarged abdominal or pelvic lymph nodes. Reproductive: Prostate is unremarkable. Other: No abdominal wall hernia or abnormality. No abdominopelvic ascites. Musculoskeletal: Normal appearing bones. IMPRESSION: Normal examination. Electronically Signed   By: Claudie Revering M.D.   On: 11/24/2020 16:24   CT Angio Abd/Pel w/ and/or w/o  Result Date: 11/25/2020 CLINICAL DATA:  Recent GI bleeding, history of hemorrhoids EXAM: CTA ABDOMEN AND PELVIS WITHOUT AND WITH CONTRAST TECHNIQUE: Multidetector CT imaging of the abdomen and pelvis was performed using the standard protocol during bolus administration of intravenous contrast. Multiplanar reconstructed images and MIPs were obtained and reviewed to evaluate the vascular anatomy. CONTRAST:  119mL OMNIPAQUE IOHEXOL 350 MG/ML SOLN COMPARISON:  CT from the previous day. FINDINGS: VASCULAR Aorta: Normal caliber aorta without aneurysm, dissection, vasculitis or significant stenosis. Celiac: Patent without evidence of aneurysm, dissection, vasculitis or significant stenosis. SMA: Patent without evidence of aneurysm, dissection, vasculitis or significant stenosis. Renals: Both renal arteries are patent without evidence of aneurysm, dissection, vasculitis, fibromuscular dysplasia or significant stenosis. IMA: Patent without evidence of aneurysm, dissection, vasculitis or significant stenosis. Iliacs: Patent without evidence of aneurysm, dissection, vasculitis or significant stenosis. Veins: No specific venous abnormality is noted. Review of the MIP images confirms the above findings. NON-VASCULAR Lower chest: No acute abnormality. Hepatobiliary: No focal liver  abnormality is seen. No gallstones, gallbladder wall thickening, or biliary dilatation. Pancreas: Unremarkable. No pancreatic ductal dilatation or surrounding inflammatory changes. Spleen: Normal in size without focal abnormality. Adrenals/Urinary Tract: Adrenal glands are unremarkable. Kidneys are normal, without renal calculi, focal lesion, or hydronephrosis. Bladder is unremarkable. Stomach/Bowel: No obstructive or inflammatory changes are noted within the colon. No areas of contrast extravasation are identified to suggest acute hemorrhage. Small bowel and stomach are within normal limits. Lymphatic: No enlarged abdominal or pelvic lymph nodes. Reproductive: Prostate is unremarkable. Other: No abdominal wall hernia or abnormality. No abdominopelvic ascites. Musculoskeletal: No acute or significant osseous findings. IMPRESSION: VASCULAR No acute vascular abnormality is noted. No findings to suggest active GI hemorrhage are seen. NON-VASCULAR No acute abnormality noted.  Unchanged from the previous day.  Electronically Signed   By: Inez Catalina M.D.   On: 11/25/2020 05:10    IMPRESSION/PLAN:  87.  35 year old male with painless hematochezia.  He was seen in the ED 11/24/2020 with bright red rectal bleeding.  Hemoglobin was 14.7 which is his baseline.  CTAP was negative. He was hemodynamically stable an ddischarged home with instructions to schedule GI follow-up.  However, he had recurrent hematochezia x 2 upon returning home and he presented back to the ED early this a.m.  Repeat hemoglobin 13.2.  CTAP 3/3 was unrevealing.  Abdominal/pelvic CT angiogram today without evidence of active GI bleeding.  He had 2 additional episodes of bright red and darker blood per the rectum while in the ED.  -Clear liquids  -N.p.o. after midnight -Most likely will have colonoscopy 11/26/2020, await further recommendations per Dr. Loletha Carrow  -Monitor H&H every 6 hours x 24 hours -Monitor patient closely for active GI  bleeding    Noralyn Pick  11/25/2020, 7:41 AM   I have reviewed the entire case in detail with the above APP and discussed the plan in detail.  Therefore, I agree with the diagnoses recorded above. In addition,  I have personally interviewed and examined the patient and have personally reviewed any abdominal/pelvic CT scan images.  My additional thoughts are as follows:  Painless hematochezia, decrease in hemoglobin was still in normal range.  He is hemodynamically stable and looks well.  Benign exam. It is behaving like diverticular or AVM bleeding, though he is younger than usual for that.  Plan is for colonoscopy tomorrow.  He was agreeable after discussion of procedure and risks, and his wife was present at the bedside for the entire visit.  The benefits and risks of the planned procedure were described in detail with the patient or (when appropriate) their health care proxy.  Risks were outlined as including, but not limited to, bleeding, infection, perforation, adverse medication reaction leading to cardiac or pulmonary decompensation, pancreatitis (if ERCP).  The limitation of incomplete mucosal visualization was also discussed.  No guarantees or warranties were given.  Serial hemoglobin and hematocrit, IV fluid support, bowel preparation tonight, and I will be on-call overnight if needed for questions or problems.  Discussed with the patient's nurse.   Nelida Meuse III [phone]

## 2020-11-25 NOTE — ED Notes (Signed)
Patient transported to CT 

## 2020-11-25 NOTE — ED Provider Notes (Signed)
[place] DEPT Provider Note   CSN: [id number] Arrival date & time: 11/25/20  0134     History No chief complaint on file.   Henry Wall is a 36 y.o. male.  35 year old male presents with blood in his stool.  Seen here recently for same and that work-up was reviewed which included blood work as well as a abdominal CT all of which did not show an etiology for his rectal bleeding.  He denies any prior history of same.  No family history of inflammatory bowel disease but does not take any blood thinners.  States that tonight when he moved his bowels he first saw bright red blood and dark red.  Has had abdominal cramping but no pain.  No fever or chills.  Denies any recent travel history.  No treatment use prior to arrival        History reviewed. No pertinent past medical history.  Patient Active Problem List   Diagnosis Date Noted  . Routine general medical examination at a health care facility 10/18/2020  . Fatigue 10/18/2020  . Vitamin D deficiency 10/18/2020    Past Surgical History:  Procedure Laterality Date  . WISDOM TOOTH EXTRACTION         Family History  Problem Relation Age of Onset  . Hypertension Mother   . Atrial fibrillation Father   . Schizophrenia Brother   . Depression Sister     Social History   Tobacco Use  . Smoking status: Never Smoker  . Smokeless tobacco: Never Used  Vaping Use  . Vaping Use: Never used  Substance Use Topics  . Alcohol use: Yes    Alcohol/week: 12.0 standard drinks    Types: 12 Shots of liquor per week  . Drug use: Never    Home Medications Prior to Admission medications   Medication Sig Start Date End Date Taking? Authorizing Provider  Cholecalciferol 1.25 MG (50000 UT) capsule Take 1 capsule (50,000 Units total) by mouth once a week. 10/18/20  Yes Janith Lima, MD  Melatonin 5 MG CHEW Chew 10 mg by mouth at bedtime.   Yes [provider]    Allergies    Patient has no  known allergies.  Review of Systems   Review of Systems  All other systems reviewed and are negative.   Physical Exam Updated Vital Signs BP 128/73 (BP Location: Left Arm)   Pulse 94   Temp 98.8 F (37.1 C) (Oral)   Resp 18   SpO2 99%   Physical Exam Vitals and nursing note reviewed.  Constitutional:      General: He is not in acute distress.    Appearance: Normal appearance. He is well-developed and well-nourished. He is not toxic-appearing.  HENT:     Head: Normocephalic and atraumatic.  Eyes:     General: Lids are normal.     Extraocular Movements: EOM normal.     Conjunctiva/sclera: Conjunctivae normal.     Pupils: Pupils are equal, round, and reactive to light.  Neck:     Thyroid: No thyroid mass.     Trachea: No tracheal deviation.  Cardiovascular:     Rate and Rhythm: Normal rate and regular rhythm.     Heart sounds: Normal heart sounds. No murmur heard. No gallop.   Pulmonary:     Effort: Pulmonary effort is normal. No respiratory distress.     Breath sounds: Normal breath sounds. No stridor. No decreased breath sounds, wheezing, rhonchi or rales.  Abdominal:  General: Bowel sounds are normal. There is no distension.     Palpations: Abdomen is soft.     Tenderness: There is no abdominal tenderness. There is no CVA tenderness or rebound.  Genitourinary:    Rectum: No mass, anal fissure, external hemorrhoid or internal hemorrhoid.     Comments: Gross blood present on digital exam Musculoskeletal:        General: No tenderness or edema. Normal range of motion.     Cervical back: Normal range of motion and neck supple.  Skin:    General: Skin is warm and dry.     Findings: No abrasion or rash.  Neurological:     Mental Status: He is alert and oriented to person, place, and time.     GCS: GCS eye subscore is 4. GCS verbal subscore is 5. GCS motor subscore is 6.     Cranial Nerves: No cranial nerve deficit.     Sensory: No sensory deficit.     Deep Tendon  Reflexes: Strength normal.  Psychiatric:        Mood and Affect: Mood and affect normal.        Speech: Speech normal.        Behavior: Behavior normal.     ED Results / Procedures / Treatments   Labs (all labs ordered are listed, but only abnormal results are displayed) Labs Reviewed  RESP PANEL BY RT-PCR (FLU A&B, COVID) ARPGX2  CBC WITH DIFFERENTIAL/PLATELET  COMPREHENSIVE METABOLIC PANEL  PROTIME-INR  APTT  TYPE AND SCREEN    EKG None  Radiology CT ABDOMEN PELVIS W CONTRAST  Result Date: 11/24/2020 CLINICAL DATA:  Gastrointestinal bleeding with bright red blood in his stool this afternoon. EXAM: CT ABDOMEN AND PELVIS WITH CONTRAST TECHNIQUE: Multidetector CT imaging of the abdomen and pelvis was performed using the standard protocol following bolus administration of intravenous contrast. CONTRAST:  196mL OMNIPAQUE IOHEXOL 300 MG/ML  SOLN COMPARISON:  None. FINDINGS: Lower chest: Unremarkable. Hepatobiliary: No focal liver abnormality is seen. No gallstones, gallbladder wall thickening, or biliary dilatation. Pancreas: Unremarkable. No pancreatic ductal dilatation or surrounding inflammatory changes. Spleen: Normal in size without focal abnormality. Adrenals/Urinary Tract: Adrenal glands are unremarkable. Kidneys are normal, without renal calculi, focal lesion, or hydronephrosis. Bladder is unremarkable. Stomach/Bowel: Stomach is within normal limits. Appendix appears normal. No evidence of bowel wall thickening, distention, or inflammatory changes. Vascular/Lymphatic: No significant vascular findings are present. No enlarged abdominal or pelvic lymph nodes. Reproductive: Prostate is unremarkable. Other: No abdominal wall hernia or abnormality. No abdominopelvic ascites. Musculoskeletal: Normal appearing bones. IMPRESSION: Normal examination. Electronically Signed   By: Claudie Revering M.D.   On: 11/24/2020 16:24    Procedures Procedures   Medications Ordered in ED Medications  0.9 %   sodium chloride infusion (has no administration in time range)    ED Course  I have reviewed the triage vital signs and the nursing notes.  Pertinent labs & imaging results that were available during my care of the patient were reviewed by me and considered in my medical decision making (see chart for details).    MDM Rules/Calculators/A&P                          Patient with gross blood per rectum.  Will admit to the hospitalist service.  Have consulted Dr. Almyra Free from gastroenterology Final Clinical Impression(s) / ED Diagnoses Final diagnoses:  None    Rx / DC Orders ED Discharge Orders  None       Lacretia Leigh, MD 11/25/20 [phone]

## 2020-11-25 NOTE — ED Notes (Signed)
Report called to [facility] , Therapist, sports on 6E

## 2020-11-25 NOTE — ED Triage Notes (Signed)
Pt was seen earlier for the same, he said he had two more bloody stools after getting home this evening Pt said one was bright red and the other darker

## 2020-11-25 NOTE — Progress Notes (Signed)
Called and requested SCD machine. SCD sleeves placed in room and explained to patient.

## 2020-11-26 ENCOUNTER — Encounter (HOSPITAL_COMMUNITY): Payer: Self-pay | Admitting: Internal Medicine

## 2020-11-26 ENCOUNTER — Observation Stay (HOSPITAL_COMMUNITY): Payer: Federal, State, Local not specified - PPO | Admitting: Certified Registered"

## 2020-11-26 ENCOUNTER — Encounter (HOSPITAL_COMMUNITY)
Admission: EM | Disposition: A | Discharge status: Home / Self Care | Payer: Self-pay | Source: Home / Self Care | Attending: Family Medicine

## 2020-11-26 DIAGNOSIS — K625 Hemorrhage of anus and rectum: Secondary | ICD-10-CM | POA: Diagnosis not present

## 2020-11-26 DIAGNOSIS — C189 Malignant neoplasm of colon, unspecified: Secondary | ICD-10-CM

## 2020-11-26 DIAGNOSIS — E559 Vitamin D deficiency, unspecified: Secondary | ICD-10-CM | POA: Diagnosis not present

## 2020-11-26 DIAGNOSIS — K5669 Other partial intestinal obstruction: Secondary | ICD-10-CM | POA: Diagnosis not present

## 2020-11-26 DIAGNOSIS — D490 Neoplasm of unspecified behavior of digestive system: Secondary | ICD-10-CM | POA: Diagnosis not present

## 2020-11-26 DIAGNOSIS — C187 Malignant neoplasm of sigmoid colon: Secondary | ICD-10-CM

## 2020-11-26 DIAGNOSIS — D62 Acute posthemorrhagic anemia: Secondary | ICD-10-CM

## 2020-11-26 DIAGNOSIS — D649 Anemia, unspecified: Secondary | ICD-10-CM | POA: Diagnosis not present

## 2020-11-26 DIAGNOSIS — K922 Gastrointestinal hemorrhage, unspecified: Secondary | ICD-10-CM | POA: Diagnosis not present

## 2020-11-26 HISTORY — DX: Malignant neoplasm of colon, unspecified: C18.9

## 2020-11-26 HISTORY — PX: COLONOSCOPY WITH PROPOFOL: SHX5780

## 2020-11-26 HISTORY — PX: SUBMUCOSAL TATTOO INJECTION: SHX6856

## 2020-11-26 LAB — CBC
HCT: 34.9 % — ABNORMAL LOW (ref 39.0–52.0)
HCT: 34.1 % — ABNORMAL LOW (ref 39.0–52.0)
Hemoglobin: 12 g/dL — ABNORMAL LOW (ref 13.0–17.0)
Hemoglobin: 11.9 g/dL — ABNORMAL LOW (ref 13.0–17.0)
MCH: 33 pg (ref 26.0–34.0)
MCH: 33.1 pg (ref 26.0–34.0)
MCHC: 34.4 g/dL (ref 30.0–36.0)
MCHC: 34.9 g/dL (ref 30.0–36.0)
MCV: 95.9 fL (ref 80.0–100.0)
MCV: 95 fL (ref 80.0–100.0)
Platelets: 152 10*3/uL (ref 150–400)
Platelets: 160 10*3/uL (ref 150–400)
RBC: 3.64 MIL/uL — ABNORMAL LOW (ref 4.22–5.81)
RBC: 3.59 MIL/uL — ABNORMAL LOW (ref 4.22–5.81)
RDW: 11.8 % (ref 11.5–15.5)
RDW: 11.6 % (ref 11.5–15.5)
WBC: 5 10*3/uL (ref 4.0–10.5)
WBC: 11.4 10*3/uL — ABNORMAL HIGH (ref 4.0–10.5)
nRBC: 0 % (ref 0.0–0.2)
nRBC: 0 % (ref 0.0–0.2)

## 2020-11-26 LAB — BASIC METABOLIC PANEL
Anion gap: 8 (ref 5–15)
BUN: 8 mg/dL (ref 6–20)
CO2: 28 mmol/L (ref 22–32)
Calcium: 9 mg/dL (ref 8.9–10.3)
Chloride: 102 mmol/L (ref 98–111)
Creatinine, Ser: 0.85 mg/dL (ref 0.61–1.24)
GFR, Estimated: >60 mL/min (ref >60)
Glucose, Bld: 90 mg/dL (ref 70–99)
Potassium: 3.9 mmol/L (ref 3.5–5.1)
Sodium: 138 mmol/L (ref 135–145)

## 2020-11-26 SURGERY — COLONOSCOPY WITH PROPOFOL
Anesthesia: Monitor Anesthesia Care

## 2020-11-26 MED ORDER — SODIUM CHLORIDE 0.9 % IV SOLN: INTRAVENOUS | Status: DC

## 2020-11-26 MED ORDER — LIDOCAINE 2% (20 MG/ML) 5 ML SYRINGE
INTRAMUSCULAR | Status: DC | PRN
  Administered 2020-11-26: 80 mg via INTRAVENOUS

## 2020-11-26 MED ORDER — SPOT INK MARKER SYRINGE KIT
PACK | SUBMUCOSAL | Status: AC
  Filled 2020-11-26: qty 5

## 2020-11-26 MED ORDER — PROPOFOL 500 MG/50ML IV EMUL
INTRAVENOUS | Status: DC | PRN
  Administered 2020-11-26: 125 ug/kg/min via INTRAVENOUS

## 2020-11-26 MED ORDER — PROPOFOL 500 MG/50ML IV EMUL
INTRAVENOUS | Status: AC
  Filled 2020-11-26: qty 50

## 2020-11-26 MED ORDER — PROPOFOL 10 MG/ML IV BOLUS
INTRAVENOUS | Status: DC | PRN
  Administered 2020-11-26: 20 mg via INTRAVENOUS
  Administered 2020-11-26: 30 mg via INTRAVENOUS

## 2020-11-26 SURGICAL SUPPLY — 22 items

## 2020-11-26 NOTE — Transfer of Care (Signed)
Immediate Anesthesia Transfer of Care Note  Patient: Henry Wall  Procedure(s) Performed: COLONOSCOPY WITH PROPOFOL (N/A ) SUBMUCOSAL TATTOO INJECTION  Patient Location: PACU  Anesthesia Type:MAC  Level of Consciousness: awake, alert  and oriented  Airway & Oxygen Therapy: Patient Spontanous Breathing and Patient connected to face mask oxygen  Post-op Assessment: Report given to RN and Post -op Vital signs reviewed and stable  Post vital signs: Reviewed and stable  Last Vitals:  Vitals Value Taken Time  BP    Temp    Pulse    Resp    SpO2      Last Pain:  Vitals:   11/26/20 0730  TempSrc: Oral  PainSc: 0-No pain      Patients Stated Pain Goal: 0 (01/56/15 3794)  Complications: No complications documented.

## 2020-11-26 NOTE — Anesthesia Preprocedure Evaluation (Addendum)
Anesthesia Evaluation  Patient identified by MRN, date of birth, ID band Patient awake    Reviewed: Allergy & Precautions, NPO status , Patient's Chart, lab work & pertinent test results  History of Anesthesia Complications Negative for: history of anesthetic complications  Airway Mallampati: II  TM Distance: >3 FB Neck ROM: Full    Dental no notable dental hx.    Pulmonary neg pulmonary ROS,    Pulmonary exam normal        Cardiovascular negative cardio ROS Normal cardiovascular exam     Neuro/Psych negative neurological ROS  negative psych ROS   GI/Hepatic Neg liver ROS, Hematochezia   Endo/Other  negative endocrine ROS  Renal/GU negative Renal ROS  negative genitourinary   Musculoskeletal negative musculoskeletal ROS (+)   Abdominal   Peds  Hematology  (+) anemia , Hgb 12.0   Anesthesia Other Findings Day of surgery medications reviewed with patient.  Reproductive/Obstetrics negative OB ROS                            Anesthesia Physical Anesthesia Plan  ASA: II and emergent  Anesthesia Plan: MAC   Post-op Pain Management:    Induction:   PONV Risk Score and Plan: 1 and Treatment may vary due to age or medical condition and Propofol infusion  Airway Management Planned: Natural Airway and Simple Face Mask  Additional Equipment: None  Intra-op Plan:   Post-operative Plan:   Informed Consent: I have reviewed the patients History and Physical, chart, labs and discussed the procedure including the risks, benefits and alternatives for the proposed anesthesia with the patient or authorized representative who has indicated his/her understanding and acceptance.       Plan Discussed with: CRNA  Anesthesia Plan Comments:        Anesthesia Quick Evaluation

## 2020-11-26 NOTE — Consult Note (Signed)
CC: colon mass  Requesting provider: Dr. Madelon Lips  HPI: Henry Wall is an 35 y.o. male who is here for rectal bleeding  This is a pleasant 35 year old gentleman with no prior history of rectal bleeding who started having bright red blood per rectum after bowel movement.  He had no rectal pain with this.  He reports of the high volume of blood.  He presented to the emergency room at that time and his hemoglobin was 14.7.  He had an unremarkable CT scan of the abdomen and pelvis.  The bleeding seemed to have subsided so he was discharged home the return next morning with further bright red blood per rectum.  He was thus admitted to the hospital. Gastroenterology was asked see the patient.  Dr. Loletha Carrow performed a colonoscopy on him this morning after a gentle bowel prep.  The patient was found to have a large, fungating mass which appeared to be near obstructing at approximately 18 to 20 cm from the anal verge.  There was a visible vessel present which was not bleeding.  For fear of creating further bleeding, the decision was made to not proceed with a biopsy.  Pictures were taken of the mass and tattoos were placed proximal distal to the mass.  No other pathology was identified. The patient reports he has had no obstructive symptoms.  He has had no further bleeding per rectum over the last 24 hours.  He has had no bloating or cramping abdominal pain.  There is no family history of colon cancer  History reviewed. No pertinent past medical history.  Past Surgical History:  Procedure Laterality Date  . WISDOM TOOTH EXTRACTION      Family History  Problem Relation Age of Onset  . Hypertension Mother   . Atrial fibrillation Father   . Schizophrenia Brother   . Depression Sister     Social:  reports that he has never smoked. He has never used smokeless tobacco. He reports current alcohol use of about 12.0 standard drinks of alcohol per week. He reports that he does not use drugs.  Allergies: No  Known Allergies  Medications: I have reviewed the patient's current medications.  Results for orders placed or performed during the hospital encounter of 11/25/20 (from the past 48 hour(s))  CBC with Differential/Platelet     Status: Abnormal   Collection Time: 11/25/20  2:09 AM  Result Value Ref Range   WBC 10.9 (H) 4.0 - 10.5 K/uL   RBC 4.00 (L) 4.22 - 5.81 MIL/uL   Hemoglobin 13.2 13.0 - 17.0 g/dL   HCT 38.5 (L) 39.0 - 52.0 %   MCV 96.3 80.0 - 100.0 fL   MCH 33.0 26.0 - 34.0 pg   MCHC 34.3 30.0 - 36.0 g/dL   RDW 11.8 11.5 - 15.5 %   Platelets 158 150 - 400 K/uL   nRBC 0.0 0.0 - 0.2 %   Neutrophils Relative % 80 %   Neutro Abs 8.7 (H) 1.7 - 7.7 K/uL   Lymphocytes Relative 14 %   Lymphs Abs 1.5 0.7 - 4.0 K/uL   Monocytes Relative 5 %   Monocytes Absolute 0.6 0.1 - 1.0 K/uL   Eosinophils Relative 1 %   Eosinophils Absolute 0.1 0.0 - 0.5 K/uL   Basophils Relative 0 %   Basophils Absolute 0.0 0.0 - 0.1 K/uL   Immature Granulocytes 0 %   Abs Immature Granulocytes 0.02 0.00 - 0.07 K/uL    Comment: Performed at [facility],  [place] [address] [place], [place] [address]  Comprehensive metabolic panel     Status: Abnormal   Collection Time: 11/25/20  2:09 AM  Result Value Ref Range   Sodium 138 135 - 145 mmol/L   Potassium 3.7 3.5 - 5.1 mmol/L   Chloride 105 98 - 111 mmol/L   CO2 22 22 - 32 mmol/L   Glucose, Bld 115 (H) 70 - 99 mg/dL    Comment: Glucose reference range applies only to samples taken after fasting for at least 8 hours.   BUN 13 6 - 20 mg/dL   Creatinine, Ser 0.92 0.61 - 1.24 mg/dL   Calcium 9.1 8.9 - 10.3 mg/dL   Total Protein 6.6 6.5 - 8.1 g/dL   Albumin 4.2 3.5 - 5.0 g/dL   AST 16 15 - 41 U/L   ALT 16 0 - 44 U/L   Alkaline Phosphatase 46 38 - 126 U/L   Total Bilirubin 0.9 0.3 - 1.2 mg/dL   GFR, Estimated >60 >60 mL/min    Comment: (NOTE) Calculated using the CKD-EPI Creatinine Equation (2021)    Anion gap 11 5 - 15    Comment: Performed  at [facility], [place] [address], [place], [place] [address]  Protime-INR     Status: None   Collection Time: 11/25/20  2:09 AM  Result Value Ref Range   Prothrombin Time 14.0 11.4 - 15.2 seconds   INR 1.1 0.8 - 1.2    Comment: (NOTE) INR goal varies based on device and disease states. Performed at [facility], [place] [address] [place], [place] [address]   APTT     Status: None   Collection Time: 11/25/20  2:09 AM  Result Value Ref Range   aPTT 29 24 - 36 seconds    Comment: Performed at [facility], [place] [address], [place], [place] [address]  Type and screen     Status: None   Collection Time: 11/25/20  2:09 AM  Result Value Ref Range   ABO/RH(D) O POS    Antibody Screen NEG    Sample Expiration      11/28/2020,2359 Performed at [facility], [place] [address], [place],  [address]   Resp Panel by RT-PCR (Flu A&B, Covid) Nasopharyngeal Swab     Status: None   Collection Time: 11/25/20  2:10 AM   Specimen: Nasopharyngeal Swab; Nasopharyngeal(NP) swabs in vial transport medium  Result Value Ref Range   SARS Coronavirus 2 by RT PCR NEGATIVE NEGATIVE    Comment: (NOTE) SARS-CoV-2 target nucleic acids are NOT DETECTED.  The SARS-CoV-2 RNA is generally detectable in upper respiratory specimens during the acute phase of infection. The lowest concentration of SARS-CoV-2 viral copies this assay can detect is 138 copies/mL. A negative result does not preclude SARS-Cov-2 infection and should not be used as the sole basis for treatment or other patient management decisions. A negative result may occur with  improper specimen collection/handling, submission of specimen other than nasopharyngeal swab, presence of viral mutation(s) within the areas targeted by this assay, and inadequate number of viral copies(<138 copies/mL). A negative result must be combined with clinical observations, patient history, and  epidemiological information. The expected result is Negative.  Fact Sheet for Patients:  EntrepreneurPulse.com.au  Fact Sheet for Healthcare Providers:  IncredibleEmployment.be  This test is no t yet approved or cleared by the [place] FDA and  has been authorized for detection and/or diagnosis of SARS-CoV-2 by FDA under an  Emergency Use Authorization (EUA). This EUA will remain  in effect (meaning this test can be used) for the duration of the COVID-19 declaration under Section 564(b)(1) of the Act, 21 U.S.C.section 360bbb-3(b)(1), unless the authorization is terminated  or revoked sooner.       Influenza A by PCR NEGATIVE NEGATIVE   Influenza B by PCR NEGATIVE NEGATIVE    Comment: (NOTE) The Xpert Xpress SARS-CoV-2/FLU/RSV plus assay is intended as an aid in the diagnosis of influenza from Nasopharyngeal swab specimens and should not be used as a sole basis for treatment. Nasal washings and aspirates are unacceptable for Xpert Xpress SARS-CoV-2/FLU/RSV testing.  Fact Sheet for Patients: EntrepreneurPulse.com.au  Fact Sheet for Healthcare Providers: IncredibleEmployment.be  This test is not yet approved or cleared by the [place] FDA and has been authorized for detection and/or diagnosis of SARS-CoV-2 by FDA under an Emergency Use Authorization (EUA). This EUA will remain in effect (meaning this test can be used) for the duration of the COVID-19 declaration under Section 564(b)(1) of the Act, 21 U.S.C. section 360bbb-3(b)(1), unless the authorization is terminated or revoked.  Performed at [facility], [place] [address], [place], [place] [address]   Hemoglobin and hematocrit, blood     Status: Abnormal   Collection Time: 11/25/20  7:43 PM  Result Value Ref Range   Hemoglobin 12.4 (L) 13.0 - 17.0 g/dL   HCT 36.5 (L) 39.0 - 52.0 %    Comment: Performed at [facility], [place] [address] [place], [place] [address]  CBC     Status: Abnormal   Collection Time: 11/26/20  5:04 AM  Result Value Ref Range   WBC 5.0 4.0 - 10.5 K/uL   RBC 3.64 (L) 4.22 - 5.81 MIL/uL   Hemoglobin 12.0 (L) 13.0 - 17.0 g/dL   HCT 34.9 (L) 39.0 - 52.0 %   MCV 95.9 80.0 - 100.0 fL   MCH 33.0 26.0 - 34.0 pg   MCHC 34.4 30.0 - 36.0 g/dL   RDW 11.8 11.5 - 15.5 %   Platelets 152 150 - 400 K/uL    Comment: REPEATED TO VERIFY   nRBC 0.0 0.0 - 0.2 %    Comment: Performed at [facility], [place] [address], [place], [place] [address]  Basic metabolic panel Once     Status: None   Collection Time: 11/26/20  5:04 AM  Result Value Ref Range   Sodium 138 135 - 145 mmol/L   Potassium 3.9 3.5 - 5.1 mmol/L   Chloride 102 98 - 111 mmol/L   CO2 28 22 - 32 mmol/L   Glucose, Bld 90 70 - 99 mg/dL    Comment: Glucose reference range applies only to samples taken after fasting for at least 8 hours.   BUN 8 6 - 20 mg/dL   Creatinine, Ser 0.85 0.61 - 1.24 mg/dL   Calcium 9.0 8.9 - 10.3 mg/dL   GFR, Estimated >60 >60 mL/min    Comment: (NOTE) Calculated using the CKD-EPI Creatinine Equation (2021)    Anion gap 8 5 - 15    Comment: Performed at [facility], [place] [address], [place],  [address]    CT ABDOMEN PELVIS W CONTRAST  Result Date: 11/24/2020 CLINICAL DATA:  Gastrointestinal bleeding with bright red blood in his stool this afternoon. EXAM: CT ABDOMEN AND PELVIS WITH CONTRAST TECHNIQUE: Multidetector CT imaging of the abdomen and pelvis was performed using the standard protocol following bolus administration of intravenous  contrast. CONTRAST:  152mL OMNIPAQUE IOHEXOL 300 MG/ML  SOLN COMPARISON:  None. FINDINGS: Lower chest: Unremarkable. Hepatobiliary: No focal liver abnormality is seen. No gallstones, gallbladder wall thickening, or biliary dilatation. Pancreas: Unremarkable. No pancreatic ductal dilatation or surrounding  inflammatory changes. Spleen: Normal in size without focal abnormality. Adrenals/Urinary Tract: Adrenal glands are unremarkable. Kidneys are normal, without renal calculi, focal lesion, or hydronephrosis. Bladder is unremarkable. Stomach/Bowel: Stomach is within normal limits. Appendix appears normal. No evidence of bowel wall thickening, distention, or inflammatory changes. Vascular/Lymphatic: No significant vascular findings are present. No enlarged abdominal or pelvic lymph nodes. Reproductive: Prostate is unremarkable. Other: No abdominal wall hernia or abnormality. No abdominopelvic ascites. Musculoskeletal: Normal appearing bones. IMPRESSION: Normal examination. Electronically Signed   By: Claudie Revering M.D.   On: 11/24/2020 16:24   CT Angio Abd/Pel w/ and/or w/o  Result Date: 11/25/2020 CLINICAL DATA:  Recent GI bleeding, history of hemorrhoids EXAM: CTA ABDOMEN AND PELVIS WITHOUT AND WITH CONTRAST TECHNIQUE: Multidetector CT imaging of the abdomen and pelvis was performed using the standard protocol during bolus administration of intravenous contrast. Multiplanar reconstructed images and MIPs were obtained and reviewed to evaluate the vascular anatomy. CONTRAST:  142mL OMNIPAQUE IOHEXOL 350 MG/ML SOLN COMPARISON:  CT from the previous day. FINDINGS: VASCULAR Aorta: Normal caliber aorta without aneurysm, dissection, vasculitis or significant stenosis. Celiac: Patent without evidence of aneurysm, dissection, vasculitis or significant stenosis. SMA: Patent without evidence of aneurysm, dissection, vasculitis or significant stenosis. Renals: Both renal arteries are patent without evidence of aneurysm, dissection, vasculitis, fibromuscular dysplasia or significant stenosis. IMA: Patent without evidence of aneurysm, dissection, vasculitis or significant stenosis. Iliacs: Patent without evidence of aneurysm, dissection, vasculitis or significant stenosis. Veins: No specific venous abnormality is noted. Review of  the MIP images confirms the above findings. NON-VASCULAR Lower chest: No acute abnormality. Hepatobiliary: No focal liver abnormality is seen. No gallstones, gallbladder wall thickening, or biliary dilatation. Pancreas: Unremarkable. No pancreatic ductal dilatation or surrounding inflammatory changes. Spleen: Normal in size without focal abnormality. Adrenals/Urinary Tract: Adrenal glands are unremarkable. Kidneys are normal, without renal calculi, focal lesion, or hydronephrosis. Bladder is unremarkable. Stomach/Bowel: No obstructive or inflammatory changes are noted within the colon. No areas of contrast extravasation are identified to suggest acute hemorrhage. Small bowel and stomach are within normal limits. Lymphatic: No enlarged abdominal or pelvic lymph nodes. Reproductive: Prostate is unremarkable. Other: No abdominal wall hernia or abnormality. No abdominopelvic ascites. Musculoskeletal: No acute or significant osseous findings. IMPRESSION: VASCULAR No acute vascular abnormality is noted. No findings to suggest active GI hemorrhage are seen. NON-VASCULAR No acute abnormality noted.  Unchanged from the previous day. Electronically Signed   By: Inez Catalina M.D.   On: 11/25/2020 05:10    ROS - all of the below systems have been reviewed with the patient and positives are indicated with bold text General: chills, fever or night sweats Eyes: blurry vision or double vision ENT: epistaxis or sore throat Allergy/Immunology: itchy/watery eyes or nasal congestion Hematologic/Lymphatic: bleeding problems, blood clots or swollen lymph nodes Endocrine: temperature intolerance or unexpected weight changes Breast: new or changing breast lumps or nipple discharge Resp: cough, shortness of breath, or wheezing CV: chest pain or dyspnea on exertion GI: as per HPI GU: dysuria, trouble voiding, or hematuria MSK: joint pain or joint stiffness Neuro: TIA or stroke symptoms Derm: pruritus and skin lesion  changes Psych: anxiety and depression  PE Blood pressure 115/62, pulse 67, temperature 98.1 F (36.7 C), temperature source Oral, resp. rate  16, SpO2 97 %. Constitutional: NAD; conversant; no deformities Eyes: Moist conjunctiva; no lid lag; anicteric; PERRL Lungs: Normal respiratory effort; no tactile fremitus CV: RRR; no palpable thrills; no pitting edema GI: Abd soft, non tender; no palpable hepatosplenomegaly Psychiatric: Appropriate affect; alert and oriented x3   Results for orders placed or performed during the hospital encounter of 11/25/20 (from the past 48 hour(s))  CBC with Differential/Platelet     Status: Abnormal   Collection Time: 11/25/20  2:09 AM  Result Value Ref Range   WBC 10.9 (H) 4.0 - 10.5 K/uL   RBC 4.00 (L) 4.22 - 5.81 MIL/uL   Hemoglobin 13.2 13.0 - 17.0 g/dL   HCT 38.5 (L) 39.0 - 52.0 %   MCV 96.3 80.0 - 100.0 fL   MCH 33.0 26.0 - 34.0 pg   MCHC 34.3 30.0 - 36.0 g/dL   RDW 11.8 11.5 - 15.5 %   Platelets 158 150 - 400 K/uL   nRBC 0.0 0.0 - 0.2 %   Neutrophils Relative % 80 %   Neutro Abs 8.7 (H) 1.7 - 7.7 K/uL   Lymphocytes Relative 14 %   Lymphs Abs 1.5 0.7 - 4.0 K/uL   Monocytes Relative 5 %   Monocytes Absolute 0.6 0.1 - 1.0 K/uL   Eosinophils Relative 1 %   Eosinophils Absolute 0.1 0.0 - 0.5 K/uL   Basophils Relative 0 %   Basophils Absolute 0.0 0.0 - 0.1 K/uL   Immature Granulocytes 0 %   Abs Immature Granulocytes 0.02 0.00 - 0.07 K/uL    Comment: Performed at [facility], [place] [address] [place], [place] [address]  Comprehensive metabolic panel     Status: Abnormal   Collection Time: 11/25/20  2:09 AM  Result Value Ref Range   Sodium 138 135 - 145 mmol/L   Potassium 3.7 3.5 - 5.1 mmol/L   Chloride 105 98 - 111 mmol/L   CO2 22 22 - 32 mmol/L   Glucose, Bld 115 (H) 70 - 99 mg/dL    Comment: Glucose reference range applies only to samples taken after fasting for at least 8 hours.   BUN 13 6 - 20 mg/dL   Creatinine, Ser  0.92 0.61 - 1.24 mg/dL   Calcium 9.1 8.9 - 10.3 mg/dL   Total Protein 6.6 6.5 - 8.1 g/dL   Albumin 4.2 3.5 - 5.0 g/dL   AST 16 15 - 41 U/L   ALT 16 0 - 44 U/L   Alkaline Phosphatase 46 38 - 126 U/L   Total Bilirubin 0.9 0.3 - 1.2 mg/dL   GFR, Estimated >60 >60 mL/min    Comment: (NOTE) Calculated using the CKD-EPI Creatinine Equation (2021)    Anion gap 11 5 - 15    Comment: Performed at [facility], [place] [address] [place], [place] [address]  Protime-INR     Status: None   Collection Time: 11/25/20  2:09 AM  Result Value Ref Range   Prothrombin Time 14.0 11.4 - 15.2 seconds   INR 1.1 0.8 - 1.2    Comment: (NOTE) INR goal varies based on device and disease states. Performed at [facility], [place] [address], [place], [place] [address]   APTT     Status: None   Collection Time: 11/25/20  2:09 AM  Result Value Ref Range   aPTT 29 24 - 36 seconds    Comment: Performed at [facility], [place] [address], [place], [place] [address]  Type and screen     Status: None   Collection Time: 11/25/20  2:09 AM  Result Value Ref Range   ABO/RH(D) O POS    Antibody Screen NEG    Sample Expiration      11/28/2020,2359 Performed at [facility], [place] [address], [place], [place] [address]   Resp Panel by RT-PCR (Flu A&B, Covid) Nasopharyngeal Swab     Status: None   Collection Time: 11/25/20  2:10 AM   Specimen: Nasopharyngeal Swab; Nasopharyngeal(NP) swabs in vial transport medium  Result Value Ref Range   SARS Coronavirus 2 by RT PCR NEGATIVE NEGATIVE    Comment: (NOTE) SARS-CoV-2 target nucleic acids are NOT DETECTED.  The SARS-CoV-2 RNA is generally detectable in upper respiratory specimens during the acute phase of infection. The lowest concentration of SARS-CoV-2 viral copies this assay can detect is 138 copies/mL. A negative result does not preclude SARS-Cov-2 infection and should not be used as the sole  basis for treatment or other patient management decisions. A negative result may occur with  improper specimen collection/handling, submission of specimen other than nasopharyngeal swab, presence of viral mutation(s) within the areas targeted by this assay, and inadequate number of viral copies(<138 copies/mL). A negative result must be combined with clinical observations, patient history, and epidemiological information. The expected result is Negative.  Fact Sheet for Patients:  EntrepreneurPulse.com.au  Fact Sheet for Healthcare Providers:  IncredibleEmployment.be  This test is no t yet approved or cleared by the [place] FDA and  has been authorized for detection and/or diagnosis of SARS-CoV-2 by FDA under an Emergency Use Authorization (EUA). This EUA will remain  in effect (meaning this test can be used) for the duration of the COVID-19 declaration under Section 564(b)(1) of the Act, 21 U.S.C.section 360bbb-3(b)(1), unless the authorization is terminated  or revoked sooner.       Influenza A by PCR NEGATIVE NEGATIVE   Influenza B by PCR NEGATIVE NEGATIVE    Comment: (NOTE) The Xpert Xpress SARS-CoV-2/FLU/RSV plus assay is intended as an aid in the diagnosis of influenza from Nasopharyngeal swab specimens and should not be used as a sole basis for treatment. Nasal washings and aspirates are unacceptable for Xpert Xpress SARS-CoV-2/FLU/RSV testing.  Fact Sheet for Patients: EntrepreneurPulse.com.au  Fact Sheet for Healthcare Providers: IncredibleEmployment.be  This test is not yet approved or cleared by the [place] FDA and has been authorized for detection and/or diagnosis of SARS-CoV-2 by FDA under an Emergency Use Authorization (EUA). This EUA will remain in effect (meaning this test can be used) for the duration of the COVID-19 declaration under Section 564(b)(1) of the Act, 21  U.S.C. section 360bbb-3(b)(1), unless the authorization is terminated or revoked.  Performed at [facility], [place] [address] [place], [place] [address]   Hemoglobin and hematocrit, blood     Status: Abnormal   Collection Time: 11/25/20  7:43 PM  Result Value Ref Range   Hemoglobin 12.4 (L) 13.0 - 17.0 g/dL   HCT 36.5 (L) 39.0 - 52.0 %    Comment: Performed at [facility], [place] [address], [place], [place] [address]  CBC     Status: Abnormal   Collection Time: 11/26/20  5:04 AM  Result Value Ref Range   WBC 5.0 4.0 - 10.5 K/uL   RBC 3.64 (L) 4.22 - 5.81 MIL/uL   Hemoglobin 12.0 (L) 13.0 - 17.0 g/dL   HCT 34.9 (L) 39.0 - 52.0 %   MCV 95.9  80.0 - 100.0 fL   MCH 33.0 26.0 - 34.0 pg   MCHC 34.4 30.0 - 36.0 g/dL   RDW 11.8 11.5 - 15.5 %   Platelets 152 150 - 400 K/uL    Comment: REPEATED TO VERIFY   nRBC 0.0 0.0 - 0.2 %    Comment: Performed at [facility], [place] [address] [place], [place] [address]  Basic metabolic panel Once     Status: None   Collection Time: 11/26/20  5:04 AM  Result Value Ref Range   Sodium 138 135 - 145 mmol/L   Potassium 3.9 3.5 - 5.1 mmol/L   Chloride 102 98 - 111 mmol/L   CO2 28 22 - 32 mmol/L   Glucose, Bld 90 70 - 99 mg/dL    Comment: Glucose reference range applies only to samples taken after fasting for at least 8 hours.   BUN 8 6 - 20 mg/dL   Creatinine, Ser 0.85 0.61 - 1.24 mg/dL   Calcium 9.0 8.9 - 10.3 mg/dL   GFR, Estimated >60 >60 mL/min    Comment: (NOTE) Calculated using the CKD-EPI Creatinine Equation (2021)    Anion gap 8 5 - 15    Comment: Performed at [facility], [place] [address], [place], [place] [address]    CT ABDOMEN PELVIS W CONTRAST  Result Date: 11/24/2020 CLINICAL DATA:  Gastrointestinal bleeding with bright red blood in his stool this afternoon. EXAM: CT ABDOMEN AND PELVIS WITH CONTRAST TECHNIQUE: Multidetector CT imaging of the abdomen and pelvis  was performed using the standard protocol following bolus administration of intravenous contrast. CONTRAST:  180mL OMNIPAQUE IOHEXOL 300 MG/ML  SOLN COMPARISON:  None. FINDINGS: Lower chest: Unremarkable. Hepatobiliary: No focal liver abnormality is seen. No gallstones, gallbladder wall thickening, or biliary dilatation. Pancreas: Unremarkable. No pancreatic ductal dilatation or surrounding inflammatory changes. Spleen: Normal in size without focal abnormality. Adrenals/Urinary Tract: Adrenal glands are unremarkable. Kidneys are normal, without renal calculi, focal lesion, or hydronephrosis. Bladder is unremarkable. Stomach/Bowel: Stomach is within normal limits. Appendix appears normal. No evidence of bowel wall thickening, distention, or inflammatory changes. Vascular/Lymphatic: No significant vascular findings are present. No enlarged abdominal or pelvic lymph nodes. Reproductive: Prostate is unremarkable. Other: No abdominal wall hernia or abnormality. No abdominopelvic ascites. Musculoskeletal: Normal appearing bones. IMPRESSION: Normal examination. Electronically Signed   By: Claudie Revering M.D.   On: 11/24/2020 16:24   CT Angio Abd/Pel w/ and/or w/o  Result Date: 11/25/2020 CLINICAL DATA:  Recent GI bleeding, history of hemorrhoids EXAM: CTA ABDOMEN AND PELVIS WITHOUT AND WITH CONTRAST TECHNIQUE: Multidetector CT imaging of the abdomen and pelvis was performed using the standard protocol during bolus administration of intravenous contrast. Multiplanar reconstructed images and MIPs were obtained and reviewed to evaluate the vascular anatomy. CONTRAST:  146mL OMNIPAQUE IOHEXOL 350 MG/ML SOLN COMPARISON:  CT from the previous day. FINDINGS: VASCULAR Aorta: Normal caliber aorta without aneurysm, dissection, vasculitis or significant stenosis. Celiac: Patent without evidence of aneurysm, dissection, vasculitis or significant stenosis. SMA: Patent without evidence of aneurysm, dissection, vasculitis or significant  stenosis. Renals: Both renal arteries are patent without evidence of aneurysm, dissection, vasculitis, fibromuscular dysplasia or significant stenosis. IMA: Patent without evidence of aneurysm, dissection, vasculitis or significant stenosis. Iliacs: Patent without evidence of aneurysm, dissection, vasculitis or significant stenosis. Veins: No specific venous abnormality is noted. Review of the MIP images confirms the above findings. NON-VASCULAR Lower chest: No acute abnormality. Hepatobiliary: No focal liver abnormality is seen. No gallstones, gallbladder wall  thickening, or biliary dilatation. Pancreas: Unremarkable. No pancreatic ductal dilatation or surrounding inflammatory changes. Spleen: Normal in size without focal abnormality. Adrenals/Urinary Tract: Adrenal glands are unremarkable. Kidneys are normal, without renal calculi, focal lesion, or hydronephrosis. Bladder is unremarkable. Stomach/Bowel: No obstructive or inflammatory changes are noted within the colon. No areas of contrast extravasation are identified to suggest acute hemorrhage. Small bowel and stomach are within normal limits. Lymphatic: No enlarged abdominal or pelvic lymph nodes. Reproductive: Prostate is unremarkable. Other: No abdominal wall hernia or abnormality. No abdominopelvic ascites. Musculoskeletal: No acute or significant osseous findings. IMPRESSION: VASCULAR No acute vascular abnormality is noted. No findings to suggest active GI hemorrhage are seen. NON-VASCULAR No acute abnormality noted.  Unchanged from the previous day. Electronically Signed   By: Inez Catalina M.D.   On: 11/25/2020 05:10     A/P: Distal sigmoid/rectal mass  I have reviewed the pictures from the colonoscopy.  There was a fairly large visible vessel versus necrotic area at the mass so I totally agree with the decision to not biopsy the mass for risk of further bleeding that could then necessitate emergent resection. The mass is not evident on CT scan and  there is no adenopathy or evidence of distal disease.  A CEA has been ordered. I discussed the findings with the patient and his wife.  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  Regardless of the diagnosis, a resection will be required due to the risk of eventual obstruction and further bleeding. Ideally, this would be able to be done by one of our colorectal surgeons robotically with a low anterior resection.  I discussed the reasons for this with the patient and his wife.  We will discuss case with our colorectal surgeons on Monday.  Hopefully he would not require an emergent resection in the interim and the surgery can be performed potentially next week.  For now, he may continue on clear liquids and I may advance him to full liquids tomorrow pending any bleeding.  Coralie Keens, M.D. [facility] Use [email or web address] to contact on call provider

## 2020-11-26 NOTE — Anesthesia Postprocedure Evaluation (Signed)
Anesthesia Post Note  Patient: Henry Wall  Procedure(s) Performed: COLONOSCOPY WITH PROPOFOL (N/A ) SUBMUCOSAL TATTOO INJECTION     Patient location during evaluation: PACU Anesthesia Type: MAC Level of consciousness: awake and alert and oriented Pain management: pain level controlled Vital Signs Assessment: post-procedure vital signs reviewed and stable Respiratory status: spontaneous breathing, nonlabored ventilation and respiratory function stable Cardiovascular status: blood pressure returned to baseline Postop Assessment: no apparent nausea or vomiting Anesthetic complications: no   No complications documented.  Last Vitals:  Vitals:   11/26/20 0900 11/26/20 0910  BP: (!) 113/58 127/71  Pulse: 75 71  Resp: 17 14  Temp:    SpO2: 100% 100%    Last Pain:  Vitals:   11/26/20 0846  TempSrc: Oral  PainSc: 0-No pain                 Brennan Bailey

## 2020-11-26 NOTE — Op Note (Signed)
[facility] Patient Name: Henry Wall Procedure Date: 11/26/2020 MRN: [id number] Attending MD: Estill Cotta. Loletha Carrow , MD Date of Birth: [birth year] CSN: [id number] Age: 35 Admit Type: Inpatient Procedure:                Colonoscopy Indications:              Hematochezia (drop in Hgb since admission but                            currently 12. still passing blood through end of                            bowel prep this AM) Providers:                Estill Cotta. Loletha Carrow, MD, Grace Isaac, RN, Laverda Sorenson, Technician, [facility], CRNA Referring MD:              Medicines:                Monitored Anesthesia Care Complications:            No immediate complications. Estimated Blood Loss:     Estimated blood loss was minimal. Procedure:                Pre-Anesthesia Assessment:                           - Prior to the procedure, a History and Physical                            was performed, and patient medications and                            allergies were reviewed. The patient's tolerance of                            previous anesthesia was also reviewed. The risks                            and benefits of the procedure and the sedation                            options and risks were discussed with the patient.                            All questions were answered, and informed consent                            was obtained. Prior Anticoagulants: The patient has                            taken no previous anticoagulant or antiplatelet  agents. ASA Grade Assessment: II - A patient with                            mild systemic disease. After reviewing the risks                            and benefits, the patient was deemed in                            satisfactory condition to undergo the procedure.                           After obtaining informed consent, the colonoscope                            was passed  under direct vision. Throughout the                            procedure, the patient's blood pressure, pulse, and                            oxygen saturations were monitored continuously. The                            CF-HQ190L ([id number] Olympus colonoscope was                            introduced through the anus and advanced to the the                            cecum, identified by appendiceal orifice and                            ileocecal valve. The colonoscopy was performed                            without difficulty. The patient tolerated the                            procedure well. The quality of the bowel                            preparation was poor. The ileocecal valve,                            appendiceal orifice, and rectum were photographed.                            The bowel preparation used was MoviPrep. Scope In: 8:15:53 AM Scope Out: 8:38:03 AM Scope Withdrawal Time: 0 hours 19 minutes 53 seconds  Total Procedure Duration: 0 hours 22 minutes 10 seconds  Findings:      The perianal and digital rectal examinations were normal.      A fungating and infiltrative partially obstructing mass was found  in the       distal sigmoid colon, with the distal aspect about 18-20cm from the anal       verge when insufflated. The mass was partially circumferential       (involving one-half of the lumen circumference), and the adult       colonoscope passed easily. The mass measured about four cm in length.       Oozing was present due to friable tissue with scope passage. There was       also high grade stiagmata of a visible vessel within the mass that was       the source of recent brisk bleeding. Area was tattooed with an injection       of 2 mL of Spot - two 0.41m injections several cm proximal and distal to       the mass).      The exam was otherwise without abnormality on direct and retroflexion       views. Impression:               - Preparation of the colon was  poor. Residual                            opaque liquid and some food debris debris - could                            not be completely cleared, but there is no large                            polyp or additional mass elsewhere in the colon.                           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 Not biopsied - it                            is malignant by appearance and the source of                            bleeding.                           - The examination was otherwise normal on direct                            and retroflexion views.                           - No specimens collected. Moderate Sedation:      MAC sedation used Recommendation:           - Return patient to hospital ward for ongoing care.                           - NPO.                           -  Surgical consultation for inpatient resection.                            More bowel prep would be ideal before surgery as                            long as patient remains stable. Procedure Code(s):        --- Professional ---                           [phone], Colonoscopy, flexible; with directed                            submucosal injection(s), any substance Diagnosis Code(s):        --- Professional ---                           C18.7, Malignant neoplasm of sigmoid colon                           K56.690, Other partial intestinal obstruction                           K92.1, Melena (includes Hematochezia) CPT copyright 2019 American Medical Association. All rights reserved. The codes documented in this report are preliminary and upon coder review may  be revised to meet current compliance requirements. Benedicto Capozzi L. Loletha Carrow, MD 11/26/2020 8:56:07 AM This report has been signed electronically. Number of Addenda: 0

## 2020-11-26 NOTE — Progress Notes (Signed)
PROGRESS NOTE    [name]  SFK:[id number] DOB: June 02, 1986 DOA: 11/25/2020 PCP: Janith Lima, MD   Brief Narrative: Henry Wall is a 35 y.o. male with no significant past medical history. Patient presented secondary to GI bleeding and found to have a malignant mass by appearance during colonoscopy.   Assessment & Plan:   Principal Problem:   BRBPR (bright red blood per rectum) Active Problems:   Malignant tumor of colon (HCC)  Colonic mass Lower GI bleed Mass is oncerning for malignancy and source of bleeding. Seen on colonoscopy. No biopsy obtained. General surgery consulted. Gi signed off. -General surgery recommendations: pending  Acute blood loss anemia Secondary to above. Mild blood loss. -Iron panel   DVT prophylaxis: SCDs Code Status:   Code Status: Full Code Family Communication: Wife at bedside Disposition Plan: Discharge home pending general surgery recommendations   Consultants:   Gastroenterology ([facility])  General surgery  Procedures:   COLONOSCOPY (11/26/2020)  Antimicrobials:  None    Subjective: No issues overnight. No abdominal pain.  Objective: Vitals:   11/26/20 0850 11/26/20 0900 11/26/20 0910 11/26/20 1332  BP: (!) 110/47 (!) 113/58 127/71 115/62  Pulse: (!) 58 75 71 67  Resp: 18 17 14 16   Temp:    98.1 F (36.7 C)  TempSrc:    Oral  SpO2: 100% 100% 100% 97%    Intake/Output Summary (Last 24 hours) at 11/26/2020 1356 Last data filed at 11/26/2020 0847 Gross per 24 hour  Intake 400 ml  Output --  Net 400 ml   There were no vitals filed for this visit.  Examination:  General exam: Appears calm and comfortable Respiratory system: Clear to auscultation. Respiratory effort normal. Cardiovascular system: S1 & S2 heard, RRR. No murmurs, rubs, gallops or clicks. Gastrointestinal system: Abdomen is nondistended, soft and nontender. No organomegaly or masses felt. Normal bowel sounds heard. Central nervous system: Alert and  oriented. No focal neurological deficits. Musculoskeletal: No edema. No calf tenderness Skin: No cyanosis. No rashes Psychiatry: Judgement and insight appear normal. Mood & affect appropriate.     Data Reviewed: I have personally reviewed following labs and imaging studies  CBC Lab Results  Component Value Date   WBC 5.0 11/26/2020   RBC 3.64 (L) 11/26/2020   HGB 12.0 (L) 11/26/2020   HCT 34.9 (L) 11/26/2020   MCV 95.9 11/26/2020   MCH 33.0 11/26/2020   PLT 152 11/26/2020   MCHC 34.4 11/26/2020   RDW 11.8 11/26/2020   LYMPHSABS 1.5 11/25/2020   MONOABS 0.6 11/25/2020   EOSABS 0.1 11/25/2020   BASOSABS 0.0 17/49/4496     Last metabolic panel Lab Results  Component Value Date   NA 138 11/26/2020   K 3.9 11/26/2020   CL 102 11/26/2020   CO2 28 11/26/2020   BUN 8 11/26/2020   CREATININE 0.85 11/26/2020   GLUCOSE 90 11/26/2020   GFRNONAA >60 11/26/2020   CALCIUM 9.0 11/26/2020   PROT 6.6 11/25/2020   ALBUMIN 4.2 11/25/2020   BILITOT 0.9 11/25/2020   ALKPHOS 46 11/25/2020   AST 16 11/25/2020   ALT 16 11/25/2020   ANIONGAP 8 11/26/2020    CBG (last 3)  No results for input(s): GLUCAP in the last 72 hours.   GFR: Estimated Creatinine Clearance: 120.1 mL/min (by C-G formula based on SCr of 0.85 mg/dL).  Coagulation Profile: Recent Labs  Lab 11/25/20 0209  INR 1.1    Recent Results (from the past 240 hour(s))  Resp Panel by  RT-PCR (Flu A&B, Covid) Nasopharyngeal Swab     Status: None   Collection Time: 11/25/20  2:10 AM   Specimen: Nasopharyngeal Swab; Nasopharyngeal(NP) swabs in vial transport medium  Result Value Ref Range Status   SARS Coronavirus 2 by RT PCR NEGATIVE NEGATIVE Final    Comment: (NOTE) SARS-CoV-2 target nucleic acids are NOT DETECTED.  The SARS-CoV-2 RNA is generally detectable in upper respiratory specimens during the acute phase of infection. The lowest concentration of SARS-CoV-2 viral copies this assay can detect is 138 copies/mL.  A negative result does not preclude SARS-Cov-2 infection and should not be used as the sole basis for treatment or other patient management decisions. A negative result may occur with  improper specimen collection/handling, submission of specimen other than nasopharyngeal swab, presence of viral mutation(s) within the areas targeted by this assay, and inadequate number of viral copies(<138 copies/mL). A negative result must be combined with clinical observations, patient history, and epidemiological information. The expected result is Negative.  Fact Sheet for Patients:  EntrepreneurPulse.com.au  Fact Sheet for Healthcare Providers:  IncredibleEmployment.be  This test is no t yet approved or cleared by the [place] FDA and  has been authorized for detection and/or diagnosis of SARS-CoV-2 by FDA under an Emergency Use Authorization (EUA). This EUA will remain  in effect (meaning this test can be used) for the duration of the COVID-19 declaration under Section 564(b)(1) of the Act, 21 U.S.C.section 360bbb-3(b)(1), unless the authorization is terminated  or revoked sooner.       Influenza A by PCR NEGATIVE NEGATIVE Final   Influenza B by PCR NEGATIVE NEGATIVE Final    Comment: (NOTE) The Xpert Xpress SARS-CoV-2/FLU/RSV plus assay is intended as an aid in the diagnosis of influenza from Nasopharyngeal swab specimens and should not be used as a sole basis for treatment. Nasal washings and aspirates are unacceptable for Xpert Xpress SARS-CoV-2/FLU/RSV testing.  Fact Sheet for Patients: EntrepreneurPulse.com.au  Fact Sheet for Healthcare Providers: IncredibleEmployment.be  This test is not yet approved or cleared by the [place] FDA and has been authorized for detection and/or diagnosis of SARS-CoV-2 by FDA under an Emergency Use Authorization (EUA). This EUA will remain in effect (meaning this test  can be used) for the duration of the COVID-19 declaration under Section 564(b)(1) of the Act, 21 U.S.C. section 360bbb-3(b)(1), unless the authorization is terminated or revoked.  Performed at [facility], [place] [address], [place], [place] [address]         Radiology Studies: CT ABDOMEN PELVIS W CONTRAST  Result Date: 11/24/2020 CLINICAL DATA:  Gastrointestinal bleeding with bright red blood in his stool this afternoon. EXAM: CT ABDOMEN AND PELVIS WITH CONTRAST TECHNIQUE: Multidetector CT imaging of the abdomen and pelvis was performed using the standard protocol following bolus administration of intravenous contrast. CONTRAST:  132mL OMNIPAQUE IOHEXOL 300 MG/ML  SOLN COMPARISON:  None. FINDINGS: Lower chest: Unremarkable. Hepatobiliary: No focal liver abnormality is seen. No gallstones, gallbladder wall thickening, or biliary dilatation. Pancreas: Unremarkable. No pancreatic ductal dilatation or surrounding inflammatory changes. Spleen: Normal in size without focal abnormality. Adrenals/Urinary Tract: Adrenal glands are unremarkable. Kidneys are normal, without renal calculi, focal lesion, or hydronephrosis. Bladder is unremarkable. Stomach/Bowel: Stomach is within normal limits. Appendix appears normal. No evidence of bowel wall thickening, distention, or inflammatory changes. Vascular/Lymphatic: No significant vascular findings are present. No enlarged abdominal or pelvic lymph nodes. Reproductive: Prostate is unremarkable. Other: No abdominal wall hernia or abnormality. No abdominopelvic ascites. Musculoskeletal: Normal appearing  bones. IMPRESSION: Normal examination. Electronically Signed   By: Claudie Revering M.D.   On: 11/24/2020 16:24   CT Angio Abd/Pel w/ and/or w/o  Result Date: 11/25/2020 CLINICAL DATA:  Recent GI bleeding, history of hemorrhoids EXAM: CTA ABDOMEN AND PELVIS WITHOUT AND WITH CONTRAST TECHNIQUE: Multidetector CT imaging of the abdomen and pelvis was  performed using the standard protocol during bolus administration of intravenous contrast. Multiplanar reconstructed images and MIPs were obtained and reviewed to evaluate the vascular anatomy. CONTRAST:  173mL OMNIPAQUE IOHEXOL 350 MG/ML SOLN COMPARISON:  CT from the previous day. FINDINGS: VASCULAR Aorta: Normal caliber aorta without aneurysm, dissection, vasculitis or significant stenosis. Celiac: Patent without evidence of aneurysm, dissection, vasculitis or significant stenosis. SMA: Patent without evidence of aneurysm, dissection, vasculitis or significant stenosis. Renals: Both renal arteries are patent without evidence of aneurysm, dissection, vasculitis, fibromuscular dysplasia or significant stenosis. IMA: Patent without evidence of aneurysm, dissection, vasculitis or significant stenosis. Iliacs: Patent without evidence of aneurysm, dissection, vasculitis or significant stenosis. Veins: No specific venous abnormality is noted. Review of the MIP images confirms the above findings. NON-VASCULAR Lower chest: No acute abnormality. Hepatobiliary: No focal liver abnormality is seen. No gallstones, gallbladder wall thickening, or biliary dilatation. Pancreas: Unremarkable. No pancreatic ductal dilatation or surrounding inflammatory changes. Spleen: Normal in size without focal abnormality. Adrenals/Urinary Tract: Adrenal glands are unremarkable. Kidneys are normal, without renal calculi, focal lesion, or hydronephrosis. Bladder is unremarkable. Stomach/Bowel: No obstructive or inflammatory changes are noted within the colon. No areas of contrast extravasation are identified to suggest acute hemorrhage. Small bowel and stomach are within normal limits. Lymphatic: No enlarged abdominal or pelvic lymph nodes. Reproductive: Prostate is unremarkable. Other: No abdominal wall hernia or abnormality. No abdominopelvic ascites. Musculoskeletal: No acute or significant osseous findings. IMPRESSION: VASCULAR No acute  vascular abnormality is noted. No findings to suggest active GI hemorrhage are seen. NON-VASCULAR No acute abnormality noted.  Unchanged from the previous day. Electronically Signed   By: Inez Catalina M.D.   On: 11/25/2020 05:10        Scheduled Meds: Continuous Infusions: . lactated ringers 125 mL/hr at 11/26/20 0807     LOS: 0 days     Cordelia Poche, MD [facility] 11/26/2020, 1:56 PM  If 7PM-7AM, please contact night-coverage [email or web address]

## 2020-11-26 NOTE — Interval H&P Note (Signed)
History and Physical Interval Note:  11/26/2020 8:00 AM  Henry Wall  has presented today for surgery, with the diagnosis of Hematochezia.  The various methods of treatment have been discussed with the patient and family. After consideration of risks, benefits and other options for treatment, the patient has consented to  Procedure(s): COLONOSCOPY WITH PROPOFOL (N/A) as a surgical intervention.  The patient's history has been reviewed, patient examined, no change in status, stable for surgery.  I have reviewed the patient's chart and labs.  Questions were answered to the patient's satisfaction.    Hemoglobin 12 this am  Nelida Meuse III

## 2020-11-27 DIAGNOSIS — Z79899 Other long term (current) drug therapy: Secondary | ICD-10-CM | POA: Diagnosis not present

## 2020-11-27 DIAGNOSIS — D490 Neoplasm of unspecified behavior of digestive system: Secondary | ICD-10-CM | POA: Diagnosis not present

## 2020-11-27 DIAGNOSIS — Z20822 Contact with and (suspected) exposure to covid-19: Secondary | ICD-10-CM | POA: Diagnosis not present

## 2020-11-27 DIAGNOSIS — Z8249 Family history of ischemic heart disease and other diseases of the circulatory system: Secondary | ICD-10-CM | POA: Diagnosis not present

## 2020-11-27 DIAGNOSIS — C189 Malignant neoplasm of colon, unspecified: Secondary | ICD-10-CM | POA: Diagnosis not present

## 2020-11-27 DIAGNOSIS — Z818 Family history of other mental and behavioral disorders: Secondary | ICD-10-CM | POA: Diagnosis not present

## 2020-11-27 DIAGNOSIS — D62 Acute posthemorrhagic anemia: Secondary | ICD-10-CM | POA: Diagnosis not present

## 2020-11-27 DIAGNOSIS — K921 Melena: Secondary | ICD-10-CM | POA: Diagnosis not present

## 2020-11-27 DIAGNOSIS — K922 Gastrointestinal hemorrhage, unspecified: Secondary | ICD-10-CM | POA: Diagnosis not present

## 2020-11-27 DIAGNOSIS — K625 Hemorrhage of anus and rectum: Secondary | ICD-10-CM | POA: Diagnosis not present

## 2020-11-27 LAB — CBC
HCT: 32.4 % — ABNORMAL LOW (ref 39.0–52.0)
Hemoglobin: 11.4 g/dL — ABNORMAL LOW (ref 13.0–17.0)
MCH: 33.6 pg (ref 26.0–34.0)
MCHC: 35.2 g/dL (ref 30.0–36.0)
MCV: 95.6 fL (ref 80.0–100.0)
Platelets: 142 10*3/uL — ABNORMAL LOW (ref 150–400)
RBC: 3.39 MIL/uL — ABNORMAL LOW (ref 4.22–5.81)
RDW: 11.7 % (ref 11.5–15.5)
WBC: 5.3 10*3/uL (ref 4.0–10.5)
nRBC: 0 % (ref 0.0–0.2)

## 2020-11-27 LAB — IRON AND TIBC
Iron: 69 ug/dL (ref 45–182)
Saturation Ratios: 25 % (ref 17.9–39.5)
TIBC: 279 ug/dL (ref 250–450)
UIBC: 210 ug/dL

## 2020-11-27 LAB — FERRITIN: Ferritin: 63 ng/mL (ref 24–336)

## 2020-11-27 NOTE — Progress Notes (Signed)
PROGRESS NOTE    Henry Wall  GMW:[id number] DOB: 05/24/1986 DOA: 11/25/2020 PCP: Janith Lima, MD   Brief Narrative: Henry Wall is a 35 y.o. male with no significant past medical history. Patient presented secondary to GI bleeding and found to have a malignant mass by appearance during colonoscopy. Plan for resection by general surgery.   Assessment & Plan:   Principal Problem:   BRBPR (bright red blood per rectum) Active Problems:   Malignant tumor of colon (HCC)   Acute blood loss anemia  Colonic mass Lower GI bleed Mass is oncerning for malignancy and source of bleeding. Seen on colonoscopy. No biopsy obtained. General surgery consulted. Gi signed off. -General surgery recommendations: pending  Acute blood loss anemia Secondary to above. Mild blood loss. Iron panel is normal.   DVT prophylaxis: SCDs Code Status:   Code Status: Full Code Family Communication: None at bedside Disposition Plan: Discharge home pending general surgery recommendations   Consultants:   Gastroenterology ([facility])  General surgery  Procedures:   COLONOSCOPY (11/26/2020)  Antimicrobials:  None    Subjective: No issues overnight. No bleeding.  Objective: Vitals:   11/26/20 1332 11/26/20 1949 11/27/20 0116 11/27/20 0534  BP: 115/62 (!) 105/57  123/65  Pulse: 67 63  63  Resp: 16 14  14   Temp: 98.1 F (36.7 C) 97.7 F (36.5 C)  98.2 F (36.8 C)  TempSrc: Oral Oral  Oral  SpO2: 97% 98%  98%  Weight:   74.8 kg   Height:   5\' 9"  (1.753 m)     Intake/Output Summary (Last 24 hours) at 11/27/2020 1154 Last data filed at 11/27/2020 1000 Gross per 24 hour  Intake 3302 ml  Output -  Net 3302 ml   Filed Weights   11/27/20 0116  Weight: 74.8 kg    Examination:  General exam: Appears calm and comfortable Respiratory system: Clear to auscultation. Respiratory effort normal. Cardiovascular system: S1 & S2 heard, RRR. No murmurs, rubs, gallops or clicks. Gastrointestinal  system: Abdomen is nondistended, soft and nontender. No organomegaly or masses felt. Normal bowel sounds heard. Central nervous system: Alert and oriented. No focal neurological deficits. Musculoskeletal: No edema. No calf tenderness Skin: No cyanosis. No rashes Psychiatry: Judgement and insight appear normal. Mood & affect appropriate.    Data Reviewed: I have personally reviewed following labs and imaging studies  CBC Lab Results  Component Value Date   WBC 5.3 11/27/2020   RBC 3.39 (L) 11/27/2020   HGB 11.4 (L) 11/27/2020   HCT 32.4 (L) 11/27/2020   MCV 95.6 11/27/2020   MCH 33.6 11/27/2020   PLT 142 (L) 11/27/2020   MCHC 35.2 11/27/2020   RDW 11.7 11/27/2020   LYMPHSABS 1.5 11/25/2020   MONOABS 0.6 11/25/2020   EOSABS 0.1 11/25/2020   BASOSABS 0.0 44/11/4740     Last metabolic panel Lab Results  Component Value Date   NA 138 11/26/2020   K 3.9 11/26/2020   CL 102 11/26/2020   CO2 28 11/26/2020   BUN 8 11/26/2020   CREATININE 0.85 11/26/2020   GLUCOSE 90 11/26/2020   GFRNONAA >60 11/26/2020   CALCIUM 9.0 11/26/2020   PROT 6.6 11/25/2020   ALBUMIN 4.2 11/25/2020   BILITOT 0.9 11/25/2020   ALKPHOS 46 11/25/2020   AST 16 11/25/2020   ALT 16 11/25/2020   ANIONGAP 8 11/26/2020    CBG (last 3)  No results for input(s): GLUCAP in the last 72 hours.   GFR: Estimated Creatinine Clearance:  120.1 mL/min (by C-G formula based on SCr of 0.85 mg/dL).  Coagulation Profile: Recent Labs  Lab 11/25/20 0209  INR 1.1    Recent Results (from the past 240 hour(s))  Resp Panel by RT-PCR (Flu A&B, Covid) Nasopharyngeal Swab     Status: None   Collection Time: 11/25/20  2:10 AM   Specimen: Nasopharyngeal Swab; Nasopharyngeal(NP) swabs in vial transport medium  Result Value Ref Range Status   SARS Coronavirus 2 by RT PCR NEGATIVE NEGATIVE Final    Comment: (NOTE) SARS-CoV-2 target nucleic acids are NOT DETECTED.  The SARS-CoV-2 RNA is generally detectable in upper  respiratory specimens during the acute phase of infection. The lowest concentration of SARS-CoV-2 viral copies this assay can detect is 138 copies/mL. A negative result does not preclude SARS-Cov-2 infection and should not be used as the sole basis for treatment or other patient management decisions. A negative result may occur with  improper specimen collection/handling, submission of specimen other than nasopharyngeal swab, presence of viral mutation(s) within the areas targeted by this assay, and inadequate number of viral copies(<138 copies/mL). A negative result must be combined with clinical observations, patient history, and epidemiological information. The expected result is Negative.  Fact Sheet for Patients:  EntrepreneurPulse.com.au  Fact Sheet for Healthcare Providers:  IncredibleEmployment.be  This test is no t yet approved or cleared by the [place] FDA and  has been authorized for detection and/or diagnosis of SARS-CoV-2 by FDA under an Emergency Use Authorization (EUA). This EUA will remain  in effect (meaning this test can be used) for the duration of the COVID-19 declaration under Section 564(b)(1) of the Act, 21 U.S.C.section 360bbb-3(b)(1), unless the authorization is terminated  or revoked sooner.       Influenza A by PCR NEGATIVE NEGATIVE Final   Influenza B by PCR NEGATIVE NEGATIVE Final    Comment: (NOTE) The Xpert Xpress SARS-CoV-2/FLU/RSV plus assay is intended as an aid in the diagnosis of influenza from Nasopharyngeal swab specimens and should not be used as a sole basis for treatment. Nasal washings and aspirates are unacceptable for Xpert Xpress SARS-CoV-2/FLU/RSV testing.  Fact Sheet for Patients: EntrepreneurPulse.com.au  Fact Sheet for Healthcare Providers: IncredibleEmployment.be  This test is not yet approved or cleared by the [place] FDA and has been  authorized for detection and/or diagnosis of SARS-CoV-2 by FDA under an Emergency Use Authorization (EUA). This EUA will remain in effect (meaning this test can be used) for the duration of the COVID-19 declaration under Section 564(b)(1) of the Act, 21 U.S.C. section 360bbb-3(b)(1), unless the authorization is terminated or revoked.  Performed at [facility], [place] [address], [place], [place] [address]         Radiology Studies: No results found.      Scheduled Meds: Continuous Infusions:    LOS: 0 days     Cordelia Poche, MD [facility] 11/27/2020, 11:54 AM  If 7PM-7AM, please contact night-coverage [email or web address]

## 2020-11-27 NOTE — Progress Notes (Signed)
1 Day Post-Op   Subjective/Chief Complaint: No complaints No further rectal bleeding since before the endo   Objective: Vital signs in last 24 hours: Temp:  [97.7 F (36.5 C)-98.9 F (37.2 C)] 98.2 F (36.8 C) (03/06 0534) Pulse Rate:  [58-75] 63 (03/06 0534) Resp:  [14-18] 14 (03/06 0534) BP: (104-127)/(47-71) 123/65 (03/06 0534) SpO2:  [97 %-100 %] 98 % (03/06 0534) Weight:  [74.8 kg] 74.8 kg (03/06 0116) Last BM Date: 11/26/20  Intake/Output from previous day: 03/05 0701 - 03/06 0700 In: 3102 [P.O.:720; I.V.:2382] Out: -  Intake/Output this shift: No intake/output data recorded.  Exam: Looks well Abdomen non tender  Lab Results:  Recent Labs    11/26/20 1706 11/27/20 0508  WBC 11.4* 5.3  HGB 11.9* 11.4*  HCT 34.1* 32.4*  PLT 160 142*   BMET Recent Labs    11/25/20 0209 11/26/20 0504  NA 138 138  K 3.7 3.9  CL 105 102  CO2 22 28  GLUCOSE 115* 90  BUN 13 8  CREATININE 0.92 0.85  CALCIUM 9.1 9.0   PT/INR Recent Labs    11/25/20 0209  LABPROT 14.0  INR 1.1   ABG No results for input(s): PHART, HCO3 in the last 72 hours.  Invalid input(s): PCO2, PO2  Studies/Results: No results found.  Anti-infectives: Anti-infectives (From admission, onward)   None      Assessment/Plan: s/p Procedure(s): COLONOSCOPY WITH PROPOFOL (N/A) SUBMUCOSAL TATTOO INJECTION  Hgb stable CEA pending  We will discuss the case with our colorectal surgeons tomorrow regarding the patient's need for a Robotic assisted Low Anterior Resection  LOS: 0 days    Coralie Keens 11/27/2020

## 2020-11-28 ENCOUNTER — Encounter (HOSPITAL_COMMUNITY): Payer: Self-pay | Admitting: Gastroenterology

## 2020-11-28 DIAGNOSIS — D62 Acute posthemorrhagic anemia: Secondary | ICD-10-CM | POA: Diagnosis not present

## 2020-11-28 DIAGNOSIS — C187 Malignant neoplasm of sigmoid colon: Secondary | ICD-10-CM | POA: Diagnosis not present

## 2020-11-28 DIAGNOSIS — C189 Malignant neoplasm of colon, unspecified: Secondary | ICD-10-CM | POA: Diagnosis not present

## 2020-11-28 DIAGNOSIS — K922 Gastrointestinal hemorrhage, unspecified: Secondary | ICD-10-CM | POA: Diagnosis not present

## 2020-11-28 DIAGNOSIS — K625 Hemorrhage of anus and rectum: Secondary | ICD-10-CM | POA: Diagnosis not present

## 2020-11-28 DIAGNOSIS — D490 Neoplasm of unspecified behavior of digestive system: Secondary | ICD-10-CM | POA: Diagnosis not present

## 2020-11-28 LAB — CEA: CEA: 0.9 ng/mL (ref 0.0–4.7)

## 2020-11-28 MED ORDER — POLYETHYLENE GLYCOL 3350 17 GM/SCOOP PO POWD
1.0000 | Freq: Once | ORAL | Status: AC
  Administered 2020-11-28: 255 g via ORAL
  Filled 2020-11-28: qty 255

## 2020-11-28 NOTE — Progress Notes (Signed)
[facility] Progress Note  2 Days Post-Op  Subjective: CC:  No complaints. Wife with him at bedside. Last BM was after his colonoscopy Saturday. Denies abd pain, nausea, vomiting, or rectal bleeding.  Denies known personal or family history of colon cancer. Denies a history of abdominal surgery   Objective: Vital signs in last 24 hours: Temp:  [97.9 F (36.6 C)-98.8 F (37.1 C)] 97.9 F (36.6 C) (03/07 0616) Pulse Rate:  [55-61] 55 (03/07 0616) Resp:  [14-15] 14 (03/07 0616) BP: (98-117)/(53-70) 98/53 (03/07 0616) SpO2:  [99 %-100 %] 99 % (03/07 0616) Last BM Date: 11/26/20  Intake/Output from previous day: 03/06 0701 - 03/07 0700 In: 1800 [P.O.:1800] Out: -  Intake/Output this shift: No intake/output data recorded.  PE: Gen:  Alert, NAD, pleasant Card:  Regular rate and rhythm, pedal pulses 2+ BL Pulm:  Normal effort, clear to auscultation bilaterally Abd: Soft, non-tender, non-distended, bowel sounds present in all 4 quadrants, no HSM Skin: warm and dry, no rashes  Psych: A&Ox3   Lab Results:  Recent Labs    11/26/20 1706 11/27/20 0508  WBC 11.4* 5.3  HGB 11.9* 11.4*  HCT 34.1* 32.4*  PLT 160 142*   BMET Recent Labs    11/26/20 0504  NA 138  K 3.9  CL 102  CO2 28  GLUCOSE 90  BUN 8  CREATININE 0.85  CALCIUM 9.0   PT/INR No results for input(s): LABPROT, INR in the last 72 hours. CMP     Component Value Date/Time   NA 138 11/26/2020 0504   K 3.9 11/26/2020 0504   CL 102 11/26/2020 0504   CO2 28 11/26/2020 0504   GLUCOSE 90 11/26/2020 0504   BUN 8 11/26/2020 0504   CREATININE 0.85 11/26/2020 0504   CALCIUM 9.0 11/26/2020 0504   PROT 6.6 11/25/2020 0209   ALBUMIN 4.2 11/25/2020 0209   AST 16 11/25/2020 0209   ALT 16 11/25/2020 0209   ALKPHOS 46 11/25/2020 0209   BILITOT 0.9 11/25/2020 0209   GFRNONAA >60 11/26/2020 0504   Lipase  No results found for: LIPASE     Studies/Results: No results  found.  Anti-infectives: Anti-infectives (From admission, onward)   None       Assessment/Plan BRBPR Distal colonic mass  - s/p colonoscopy 11/26/20 by Dr. Loletha Carrow, malignant appearing mass, partially obstructing, no Bx obtained - CEA 0.9 - hgb/hct stable, no further bleeding since scope  - would likely benefit from surgical resection while in the hospital. Will discuss timing of surgery with Dr. Martin/colorectal surgeons. May give gentle bowel prep today if planning for surgery in the next couple days.    LOS: 1 day    Obie Dredge, [facility] Please see Amion for pager number during day hours 7:00am-4:30pm

## 2020-11-28 NOTE — Progress Notes (Signed)
PROGRESS NOTE    Henry Wall  ZOX:[id number] DOB: [birth year] DOA: 11/25/2020 PCP: Janith Lima, MD   Brief Narrative: Henry Wall is a 35 y.o. male with no significant past medical history. Patient presented secondary to GI bleeding and found to have a malignant mass by appearance during colonoscopy. Plan for resection by general surgery.   Assessment & Plan:   Principal Problem:   BRBPR (bright red blood per rectum) Active Problems:   Malignant tumor of colon (HCC)   Acute blood loss anemia  Colonic mass Lower GI bleed Mass is oncerning for malignancy and source of bleeding. Seen on colonoscopy. No biopsy obtained. General surgery consulted for consideration of tumor resection. Gi signed off. CEA of 0.9 -General surgery recommendations: pending  Acute blood loss anemia Secondary to above. Mild blood loss. Iron panel is normal.   DVT prophylaxis: SCDs Code Status:   Code Status: Full Code Family Communication: Wife at bedside Disposition Plan: Discharge home pending general surgery recommendations   Consultants:   Gastroenterology ([facility])  General surgery  Procedures:   COLONOSCOPY (11/26/2020) Impression:               - Preparation of the colon was poor. Residual                            opaque liquid and some food debris debris - could                            not be completely cleared, but there is no large                            polyp or additional mass elsewhere in the colon.                           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 Not biopsied - it                            is malignant by appearance and the source of                            bleeding.                           - The examination was otherwise normal on direct                            and retroflexion views.                           - No specimens collected. Recommendation:           - Return  patient to hospital ward for ongoing care.                           - NPO.                           -  Surgical consultation for inpatient resection.                            More bowel prep would be ideal before surgery as                            long as patient remains stable.  Antimicrobials:  None    Subjective: No GI bleeding noted. No other concerns.  Objective: Vitals:   11/27/20 0534 11/27/20 1308 11/27/20 2018 11/28/20 0616  BP: 123/65 117/70 113/65 (!) 98/53  Pulse: 63 61 (!) 55 (!) 55  Resp: 14 15 14 14   Temp: 98.2 F (36.8 C) 98.8 F (37.1 C) 97.9 F (36.6 C) 97.9 F (36.6 C)  TempSrc: Oral Oral Oral Oral  SpO2: 98% 100% 100% 99%  Weight:      Height:        Intake/Output Summary (Last 24 hours) at 11/28/2020 0911 Last data filed at 11/27/2020 2100 Gross per 24 hour  Intake 1800 ml  Output --  Net 1800 ml   Filed Weights   11/27/20 0116  Weight: 74.8 kg    Examination:  General exam: Appears calm and comfortable Respiratory system: Clear to auscultation. Respiratory effort normal. Cardiovascular system: S1 & S2 heard, RRR. No murmurs, rubs, gallops or clicks. Gastrointestinal system: Abdomen is nondistended, soft and nontender. No organomegaly or masses felt. Normal bowel sounds heard. Central nervous system: Alert and oriented. No focal neurological deficits. Musculoskeletal: No edema. No calf tenderness Skin: No cyanosis. No rashes Psychiatry: Judgement and insight appear normal. Mood & affect appropriate.    Data Reviewed: I have personally reviewed following labs and imaging studies  CBC Lab Results  Component Value Date   WBC 5.3 11/27/2020   RBC 3.39 (L) 11/27/2020   HGB 11.4 (L) 11/27/2020   HCT 32.4 (L) 11/27/2020   MCV 95.6 11/27/2020   MCH 33.6 11/27/2020   PLT 142 (L) 11/27/2020   MCHC 35.2 11/27/2020   RDW 11.7 11/27/2020   LYMPHSABS 1.5 11/25/2020   MONOABS 0.6 11/25/2020   EOSABS 0.1 11/25/2020   BASOSABS 0.0  05/07/4817     Last metabolic panel Lab Results  Component Value Date   NA 138 11/26/2020   K 3.9 11/26/2020   CL 102 11/26/2020   CO2 28 11/26/2020   BUN 8 11/26/2020   CREATININE 0.85 11/26/2020   GLUCOSE 90 11/26/2020   GFRNONAA >60 11/26/2020   CALCIUM 9.0 11/26/2020   PROT 6.6 11/25/2020   ALBUMIN 4.2 11/25/2020   BILITOT 0.9 11/25/2020   ALKPHOS 46 11/25/2020   AST 16 11/25/2020   ALT 16 11/25/2020   ANIONGAP 8 11/26/2020    CBG (last 3)  No results for input(s): GLUCAP in the last 72 hours.   GFR: Estimated Creatinine Clearance: 120.1 mL/min (by C-G formula based on SCr of 0.85 mg/dL).  Coagulation Profile: Recent Labs  Lab 11/25/20 0209  INR 1.1    Recent Results (from the past 240 hour(s))  Resp Panel by RT-PCR (Flu A&B, Covid) Nasopharyngeal Swab     Status: None   Collection Time: 11/25/20  2:10 AM   Specimen: Nasopharyngeal Swab; Nasopharyngeal(NP) swabs in vial transport medium  Result Value Ref Range Status   SARS Coronavirus 2 by RT PCR NEGATIVE NEGATIVE Final    Comment: (NOTE) SARS-CoV-2 target nucleic acids are NOT DETECTED.  The SARS-CoV-2 RNA is generally detectable  in upper respiratory specimens during the acute phase of infection. The lowest concentration of SARS-CoV-2 viral copies this assay can detect is 138 copies/mL. A negative result does not preclude SARS-Cov-2 infection and should not be used as the sole basis for treatment or other patient management decisions. A negative result may occur with  improper specimen collection/handling, submission of specimen other than nasopharyngeal swab, presence of viral mutation(s) within the areas targeted by this assay, and inadequate number of viral copies(<138 copies/mL). A negative result must be combined with clinical observations, patient history, and epidemiological information. The expected result is Negative.  Fact Sheet for Patients:   EntrepreneurPulse.com.au  Fact Sheet for Healthcare Providers:  IncredibleEmployment.be  This test is no t yet approved or cleared by the [place] FDA and  has been authorized for detection and/or diagnosis of SARS-CoV-2 by FDA under an Emergency Use Authorization (EUA). This EUA will remain  in effect (meaning this test can be used) for the duration of the COVID-19 declaration under Section 564(b)(1) of the Act, 21 U.S.C.section 360bbb-3(b)(1), unless the authorization is terminated  or revoked sooner.       Influenza A by PCR NEGATIVE NEGATIVE Final   Influenza B by PCR NEGATIVE NEGATIVE Final    Comment: (NOTE) The Xpert Xpress SARS-CoV-2/FLU/RSV plus assay is intended as an aid in the diagnosis of influenza from Nasopharyngeal swab specimens and should not be used as a sole basis for treatment. Nasal washings and aspirates are unacceptable for Xpert Xpress SARS-CoV-2/FLU/RSV testing.  Fact Sheet for Patients: EntrepreneurPulse.com.au  Fact Sheet for Healthcare Providers: IncredibleEmployment.be  This test is not yet approved or cleared by the [place] FDA and has been authorized for detection and/or diagnosis of SARS-CoV-2 by FDA under an Emergency Use Authorization (EUA). This EUA will remain in effect (meaning this test can be used) for the duration of the COVID-19 declaration under Section 564(b)(1) of the Act, 21 U.S.C. section 360bbb-3(b)(1), unless the authorization is terminated or revoked.  Performed at [facility], [place] [address], [place], [place] [address]         Radiology Studies: No results found.      Scheduled Meds: Continuous Infusions:    LOS: 1 day     Cordelia Poche, MD [facility] 11/28/2020, 9:11 AM  If 7PM-7AM, please contact night-coverage [email or web address]

## 2020-11-29 DIAGNOSIS — D490 Neoplasm of unspecified behavior of digestive system: Secondary | ICD-10-CM | POA: Diagnosis not present

## 2020-11-29 DIAGNOSIS — D62 Acute posthemorrhagic anemia: Secondary | ICD-10-CM | POA: Diagnosis not present

## 2020-11-29 DIAGNOSIS — K922 Gastrointestinal hemorrhage, unspecified: Secondary | ICD-10-CM | POA: Diagnosis not present

## 2020-11-29 DIAGNOSIS — C189 Malignant neoplasm of colon, unspecified: Secondary | ICD-10-CM | POA: Diagnosis not present

## 2020-11-29 DIAGNOSIS — K625 Hemorrhage of anus and rectum: Secondary | ICD-10-CM | POA: Diagnosis not present

## 2020-11-29 DIAGNOSIS — C187 Malignant neoplasm of sigmoid colon: Secondary | ICD-10-CM | POA: Diagnosis not present

## 2020-11-29 MED ORDER — CHLORHEXIDINE GLUCONATE CLOTH 2 % EX PADS: 6.0000 | MEDICATED_PAD | Freq: Once | CUTANEOUS | Status: DC

## 2020-11-29 MED ORDER — ALVIMOPAN 12 MG PO CAPS
12.0000 mg | ORAL_CAPSULE | ORAL | Status: AC
  Administered 2020-11-30: 12 mg via ORAL
  Filled 2020-11-29: qty 1

## 2020-11-29 MED ORDER — SODIUM CHLORIDE 0.9 % IV SOLN
2.0000 g | INTRAVENOUS | Status: AC
  Administered 2020-11-30: 2 g via INTRAVENOUS
  Filled 2020-11-29 (×2): qty 2

## 2020-11-29 MED ORDER — ENSURE PRE-SURGERY PO LIQD
296.0000 mL | Freq: Once | ORAL | Status: AC
  Administered 2020-11-30: 296 mL via ORAL
  Filled 2020-11-29: qty 296

## 2020-11-29 MED ORDER — POLYETHYLENE GLYCOL 3350 17 GM/SCOOP PO POWD
0.5000 | Freq: Once | ORAL | Status: AC
  Administered 2020-11-29: 127.5 g via ORAL
  Filled 2020-11-29: qty 255

## 2020-11-29 MED ORDER — ENSURE PRE-SURGERY PO LIQD: 592.0000 mL | Freq: Once | ORAL | Status: DC

## 2020-11-29 MED ORDER — CHLORHEXIDINE GLUCONATE CLOTH 2 % EX PADS
6.0000 | MEDICATED_PAD | Freq: Once | CUTANEOUS | Status: AC
  Administered 2020-11-29: 6 via TOPICAL

## 2020-11-29 MED ORDER — CHLORHEXIDINE GLUCONATE CLOTH 2 % EX PADS
6.0000 | MEDICATED_PAD | Freq: Once | CUTANEOUS | Status: AC
  Administered 2020-11-30: 6 via TOPICAL

## 2020-11-29 MED ORDER — ENSURE PRE-SURGERY PO LIQD
592.0000 mL | Freq: Once | ORAL | Status: AC
  Administered 2020-11-29: 592 mL via ORAL
  Filled 2020-11-29: qty 592

## 2020-11-29 MED ORDER — SODIUM CHLORIDE 0.9 % IV SOLN: 2.0000 g | INTRAVENOUS | Status: DC

## 2020-11-29 NOTE — Progress Notes (Signed)
PROGRESS NOTE    Henry Wall  XKG:[id number] DOB: 02/24/1986 DOA: 11/25/2020 PCP: Janith Lima, MD   Brief Narrative: Henry Wall is a 35 y.o. male with no significant past medical history. Patient presented secondary to GI bleeding and found to have a malignant mass by appearance during colonoscopy. Plan for resection by general surgery.   Assessment & Plan:   Principal Problem:   BRBPR (bright red blood per rectum) Active Problems:   Malignant tumor of colon (HCC)   Acute blood loss anemia  Colonic mass Lower GI bleed Mass is oncerning for malignancy and source of bleeding. Seen on colonoscopy. No biopsy obtained. General surgery consulted for consideration of tumor resection. Gi signed off. CEA of 0.9 -General surgery recommendations: per patient, plan for surgery on 3/9; formal recommendations pending  Acute blood loss anemia Secondary to above. Mild blood loss. Iron panel is normal. -CBC in AM   DVT prophylaxis: SCDs Code Status:   Code Status: Full Code Family Communication: None at bedside Disposition Plan: Discharge home pending general surgery recommendations   Consultants:   Gastroenterology ([facility])  General surgery  Procedures:   COLONOSCOPY (11/26/2020) Impression:               - Preparation of the colon was poor. Residual                            opaque liquid and some food debris debris - could                            not be completely cleared, but there is no large                            polyp or additional mass elsewhere in the colon.                           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 Not biopsied - it                            is malignant by appearance and the source of                            bleeding.                           - The examination was otherwise normal on direct                            and retroflexion views.                            - No specimens collected. Recommendation:           - Return patient to hospital ward for ongoing care.                           -  NPO.                           - Surgical consultation for inpatient resection.                            More bowel prep would be ideal before surgery as                            long as patient remains stable.  Antimicrobials:  None    Subjective: Bowel movement with brown stool. No bleeding noted.  Objective: Vitals:   11/28/20 0616 11/28/20 1409 11/28/20 2038 11/29/20 0634  BP: (!) 98/53 123/71 98/63 106/63  Pulse: (!) 55 (!) 57 (!) 54 60  Resp: 14 16 14 14   Temp: 97.9 F (36.6 C) 99.1 F (37.3 C) 97.9 F (36.6 C) (!) 97.4 F (36.3 C)  TempSrc: Oral Oral Oral Oral  SpO2: 99% 100% 100% 100%  Weight:      Height:        Intake/Output Summary (Last 24 hours) at 11/29/2020 0930 Last data filed at 11/29/2020 0630 Gross per 24 hour  Intake 1320 ml  Output --  Net 1320 ml   Filed Weights   11/27/20 0116  Weight: 74.8 kg    Examination:  General exam: Appears calm and comfortable Respiratory system: Clear to auscultation. Respiratory effort normal. Cardiovascular system: S1 & S2 heard, RRR. No murmurs, rubs, gallops or clicks. Gastrointestinal system: Abdomen is nondistended, soft and nontender. No organomegaly or masses felt. Normal bowel sounds heard. Central nervous system: Alert and oriented. No focal neurological deficits. Musculoskeletal: No edema. No calf tenderness Skin: No cyanosis. No rashes Psychiatry: Judgement and insight appear normal. Mood & affect appropriate.    Data Reviewed: I have personally reviewed following labs and imaging studies  CBC Lab Results  Component Value Date   WBC 5.3 11/27/2020   RBC 3.39 (L) 11/27/2020   HGB 11.4 (L) 11/27/2020   HCT 32.4 (L) 11/27/2020   MCV 95.6 11/27/2020   MCH 33.6 11/27/2020   PLT 142 (L) 11/27/2020   MCHC 35.2 11/27/2020   RDW 11.7 11/27/2020   LYMPHSABS 1.5  11/25/2020   MONOABS 0.6 11/25/2020   EOSABS 0.1 11/25/2020   BASOSABS 0.0 24/23/5361     Last metabolic panel Lab Results  Component Value Date   NA 138 11/26/2020   K 3.9 11/26/2020   CL 102 11/26/2020   CO2 28 11/26/2020   BUN 8 11/26/2020   CREATININE 0.85 11/26/2020   GLUCOSE 90 11/26/2020   GFRNONAA >60 11/26/2020   CALCIUM 9.0 11/26/2020   PROT 6.6 11/25/2020   ALBUMIN 4.2 11/25/2020   BILITOT 0.9 11/25/2020   ALKPHOS 46 11/25/2020   AST 16 11/25/2020   ALT 16 11/25/2020   ANIONGAP 8 11/26/2020    CBG (last 3)  No results for input(s): GLUCAP in the last 72 hours.   GFR: Estimated Creatinine Clearance: 120.1 mL/min (by C-G formula based on SCr of 0.85 mg/dL).  Coagulation Profile: Recent Labs  Lab 11/25/20 0209  INR 1.1    Recent Results (from the past 240 hour(s))  Resp Panel by RT-PCR (Flu A&B, Covid) Nasopharyngeal Swab     Status: None   Collection Time: 11/25/20  2:10 AM   Specimen: Nasopharyngeal Swab; Nasopharyngeal(NP) swabs in vial transport medium  Result Value Ref Range  Status   SARS Coronavirus 2 by RT PCR NEGATIVE NEGATIVE Final    Comment: (NOTE) SARS-CoV-2 target nucleic acids are NOT DETECTED.  The SARS-CoV-2 RNA is generally detectable in upper respiratory specimens during the acute phase of infection. The lowest concentration of SARS-CoV-2 viral copies this assay can detect is 138 copies/mL. A negative result does not preclude SARS-Cov-2 infection and should not be used as the sole basis for treatment or other patient management decisions. A negative result may occur with  improper specimen collection/handling, submission of specimen other than nasopharyngeal swab, presence of viral mutation(s) within the areas targeted by this assay, and inadequate number of viral copies(<138 copies/mL). A negative result must be combined with clinical observations, patient history, and epidemiological information. The expected result is  Negative.  Fact Sheet for Patients:  EntrepreneurPulse.com.au  Fact Sheet for Healthcare Providers:  IncredibleEmployment.be  This test is no t yet approved or cleared by the [place] FDA and  has been authorized for detection and/or diagnosis of SARS-CoV-2 by FDA under an Emergency Use Authorization (EUA). This EUA will remain  in effect (meaning this test can be used) for the duration of the COVID-19 declaration under Section 564(b)(1) of the Act, 21 U.S.C.section 360bbb-3(b)(1), unless the authorization is terminated  or revoked sooner.       Influenza A by PCR NEGATIVE NEGATIVE Final   Influenza B by PCR NEGATIVE NEGATIVE Final    Comment: (NOTE) The Xpert Xpress SARS-CoV-2/FLU/RSV plus assay is intended as an aid in the diagnosis of influenza from Nasopharyngeal swab specimens and should not be used as a sole basis for treatment. Nasal washings and aspirates are unacceptable for Xpert Xpress SARS-CoV-2/FLU/RSV testing.  Fact Sheet for Patients: EntrepreneurPulse.com.au  Fact Sheet for Healthcare Providers: IncredibleEmployment.be  This test is not yet approved or cleared by the [place] FDA and has been authorized for detection and/or diagnosis of SARS-CoV-2 by FDA under an Emergency Use Authorization (EUA). This EUA will remain in effect (meaning this test can be used) for the duration of the COVID-19 declaration under Section 564(b)(1) of the Act, 21 U.S.C. section 360bbb-3(b)(1), unless the authorization is terminated or revoked.  Performed at [facility], [place] [address], [place], [place] [address]         Radiology Studies: No results found.      Scheduled Meds: Continuous Infusions:    LOS: 2 days     Cordelia Poche, MD [facility] 11/29/2020, 9:30 AM  If 7PM-7AM, please contact night-coverage [email or web address]

## 2020-11-29 NOTE — Progress Notes (Signed)
[facility] Progress Note  3 Days Post-Op  Subjective: CC:  No complaints. Denies abdominal pain. Denies nausea/vomiting. Had multiple BMs after bottle of miralax - they are liquid stools but they are not clear yet.   Objective: Vital signs in last 24 hours: Temp:  [97.4 F (36.3 C)-99.1 F (37.3 C)] 97.4 F (36.3 C) (03/08 0634) Pulse Rate:  [54-60] 60 (03/08 0634) Resp:  [14-16] 14 (03/08 0634) BP: (98-123)/(63-71) 106/63 (03/08 0634) SpO2:  [100 %] 100 % (03/08 0634) Last BM Date: 11/28/20  Intake/Output from previous day: 03/07 0701 - 03/08 0700 In: 1560 [P.O.:1560] Out: -  Intake/Output this shift: No intake/output data recorded.  PE: Gen:  Alert, NAD, pleasant Card:  Regular rate and rhythm, pedal pulses 2+ BL Pulm:  Normal effort, clear to auscultation bilaterally Abd: Soft, non-tender, non-distended, bowel sounds present in all 4 quadrants, no HSM Skin: warm and dry, no rashes  Psych: A&Ox3   Lab Results:  Recent Labs    11/26/20 1706 11/27/20 0508  WBC 11.4* 5.3  HGB 11.9* 11.4*  HCT 34.1* 32.4*  PLT 160 142*   BMET No results for input(s): NA, K, CL, CO2, GLUCOSE, BUN, CREATININE, CALCIUM in the last 72 hours. PT/INR No results for input(s): LABPROT, INR in the last 72 hours. CMP     Component Value Date/Time   NA 138 11/26/2020 0504   K 3.9 11/26/2020 0504   CL 102 11/26/2020 0504   CO2 28 11/26/2020 0504   GLUCOSE 90 11/26/2020 0504   BUN 8 11/26/2020 0504   CREATININE 0.85 11/26/2020 0504   CALCIUM 9.0 11/26/2020 0504   PROT 6.6 11/25/2020 0209   ALBUMIN 4.2 11/25/2020 0209   AST 16 11/25/2020 0209   ALT 16 11/25/2020 0209   ALKPHOS 46 11/25/2020 0209   BILITOT 0.9 11/25/2020 0209   GFRNONAA >60 11/26/2020 0504   Lipase  No results found for: LIPASE     Studies/Results: No results found.  Anti-infectives: Anti-infectives (From admission, onward)   None       Assessment/Plan BRBPR Distal colonic mass  -  s/p colonoscopy 11/26/20 by Dr. Loletha Carrow, malignant appearing mass, partially obstructing, no Bx obtained - CEA 0.9 - hgb/hct stable, no further bleeding since scope  - continue CLD, ERAS pre-op protocol ordered  - NPO after MN for laparoscopic assisted sigmoid colectomy tomorrow by Dr. Hassell Done.    LOS: 2 days    Obie Dredge, [facility] Please see Amion for pager number during day hours 7:00am-4:30pm

## 2020-11-29 NOTE — H&P (View-Only) (Signed)
[facility] Progress Note  3 Days Post-Op  Subjective: CC:  No complaints. Denies abdominal pain. Denies nausea/vomiting. Had multiple BMs after bottle of miralax - they are liquid stools but they are not clear yet.   Objective: Vital signs in last 24 hours: Temp:  [97.4 F (36.3 C)-99.1 F (37.3 C)] 97.4 F (36.3 C) (03/08 0634) Pulse Rate:  [54-60] 60 (03/08 0634) Resp:  [14-16] 14 (03/08 0634) BP: (98-123)/(63-71) 106/63 (03/08 0634) SpO2:  [100 %] 100 % (03/08 0634) Last BM Date: 11/28/20  Intake/Output from previous day: 03/07 0701 - 03/08 0700 In: 1560 [P.O.:1560] Out: -  Intake/Output this shift: No intake/output data recorded.  PE: Gen:  Alert, NAD, pleasant Card:  Regular rate and rhythm, pedal pulses 2+ BL Pulm:  Normal effort, clear to auscultation bilaterally Abd: Soft, non-tender, non-distended, bowel sounds present in all 4 quadrants, no HSM Skin: warm and dry, no rashes  Psych: A&Ox3   Lab Results:  Recent Labs    11/26/20 1706 11/27/20 0508  WBC 11.4* 5.3  HGB 11.9* 11.4*  HCT 34.1* 32.4*  PLT 160 142*   BMET No results for input(s): NA, K, CL, CO2, GLUCOSE, BUN, CREATININE, CALCIUM in the last 72 hours. PT/INR No results for input(s): LABPROT, INR in the last 72 hours. CMP     Component Value Date/Time   NA 138 11/26/2020 0504   K 3.9 11/26/2020 0504   CL 102 11/26/2020 0504   CO2 28 11/26/2020 0504   GLUCOSE 90 11/26/2020 0504   BUN 8 11/26/2020 0504   CREATININE 0.85 11/26/2020 0504   CALCIUM 9.0 11/26/2020 0504   PROT 6.6 11/25/2020 0209   ALBUMIN 4.2 11/25/2020 0209   AST 16 11/25/2020 0209   ALT 16 11/25/2020 0209   ALKPHOS 46 11/25/2020 0209   BILITOT 0.9 11/25/2020 0209   GFRNONAA >60 11/26/2020 0504   Lipase  No results found for: LIPASE     Studies/Results: No results found.  Anti-infectives: Anti-infectives (From admission, onward)   None       Assessment/Plan BRBPR Distal colonic mass  -  s/p colonoscopy 11/26/20 by Dr. Loletha Carrow, malignant appearing mass, partially obstructing, no Bx obtained - CEA 0.9 - hgb/hct stable, no further bleeding since scope  - continue CLD, ERAS pre-op protocol ordered  - NPO after MN for laparoscopic assisted sigmoid colectomy tomorrow by Dr. Hassell Done.    LOS: 2 days    Obie Dredge, [facility] Please see Amion for pager number during day hours 7:00am-4:30pm

## 2020-11-30 ENCOUNTER — Encounter (HOSPITAL_COMMUNITY)
Admission: EM | Disposition: A | Discharge status: Home / Self Care | Payer: Self-pay | Source: Home / Self Care | Attending: Family Medicine

## 2020-11-30 ENCOUNTER — Inpatient Hospital Stay (HOSPITAL_COMMUNITY): Payer: Federal, State, Local not specified - PPO | Admitting: Certified Registered Nurse Anesthetist

## 2020-11-30 ENCOUNTER — Encounter (HOSPITAL_COMMUNITY): Payer: Self-pay | Admitting: Internal Medicine

## 2020-11-30 ENCOUNTER — Other Ambulatory Visit: Payer: Self-pay

## 2020-11-30 DIAGNOSIS — C187 Malignant neoplasm of sigmoid colon: Secondary | ICD-10-CM | POA: Diagnosis not present

## 2020-11-30 DIAGNOSIS — E559 Vitamin D deficiency, unspecified: Secondary | ICD-10-CM | POA: Diagnosis not present

## 2020-11-30 DIAGNOSIS — D62 Acute posthemorrhagic anemia: Secondary | ICD-10-CM | POA: Diagnosis not present

## 2020-11-30 DIAGNOSIS — K625 Hemorrhage of anus and rectum: Secondary | ICD-10-CM | POA: Diagnosis not present

## 2020-11-30 HISTORY — PX: COLOSTOMY: SHX63

## 2020-11-30 LAB — BASIC METABOLIC PANEL
Anion gap: 7 (ref 5–15)
BUN: 7 mg/dL (ref 6–20)
CO2: 27 mmol/L (ref 22–32)
Calcium: 9 mg/dL (ref 8.9–10.3)
Chloride: 103 mmol/L (ref 98–111)
Creatinine, Ser: 0.84 mg/dL (ref 0.61–1.24)
GFR, Estimated: >60 mL/min (ref >60)
Glucose, Bld: 206 mg/dL — ABNORMAL HIGH (ref 70–99)
Potassium: 3.5 mmol/L (ref 3.5–5.1)
Sodium: 137 mmol/L (ref 135–145)

## 2020-11-30 LAB — CBC
HCT: 33.1 % — ABNORMAL LOW (ref 39.0–52.0)
Hemoglobin: 11.7 g/dL — ABNORMAL LOW (ref 13.0–17.0)
MCH: 33.1 pg (ref 26.0–34.0)
MCHC: 35.3 g/dL (ref 30.0–36.0)
MCV: 93.5 fL (ref 80.0–100.0)
Platelets: 175 10*3/uL (ref 150–400)
RBC: 3.54 MIL/uL — ABNORMAL LOW (ref 4.22–5.81)
RDW: 11.5 % (ref 11.5–15.5)
WBC: 5 10*3/uL (ref 4.0–10.5)
nRBC: 0 % (ref 0.0–0.2)

## 2020-11-30 SURGERY — COLECTOMY, PARTIAL, ROBOT-ASSISTED, LAPAROSCOPIC
Anesthesia: General

## 2020-11-30 MED ORDER — FENTANYL CITRATE (PF) 250 MCG/5ML IJ SOLN
INTRAMUSCULAR | Status: AC
  Filled 2020-11-30: qty 5

## 2020-11-30 MED ORDER — MIDAZOLAM HCL 2 MG/2ML IJ SOLN
INTRAMUSCULAR | Status: AC
  Filled 2020-11-30: qty 2

## 2020-11-30 MED ORDER — MEPERIDINE HCL 50 MG/ML IJ SOLN
6.2500 mg | INTRAMUSCULAR | Status: DC | PRN
Start: 2020-11-30 — End: 2020-11-30

## 2020-11-30 MED ORDER — MIDAZOLAM HCL 2 MG/2ML IJ SOLN: 0.5000 mg | Freq: Once | INTRAMUSCULAR | Status: DC | PRN

## 2020-11-30 MED ORDER — EPHEDRINE 5 MG/ML INJ
INTRAVENOUS | Status: AC
  Filled 2020-11-30: qty 10

## 2020-11-30 MED ORDER — ONDANSETRON HCL 4 MG/2ML IJ SOLN
INTRAMUSCULAR | Status: DC | PRN
  Administered 2020-11-30: 4 mg via INTRAVENOUS

## 2020-11-30 MED ORDER — LACTATED RINGERS IV SOLN: INTRAVENOUS | Status: DC

## 2020-11-30 MED ORDER — PHENYLEPHRINE 40 MCG/ML (10ML) SYRINGE FOR IV PUSH (FOR BLOOD PRESSURE SUPPORT)
PREFILLED_SYRINGE | INTRAVENOUS | Status: AC
  Filled 2020-11-30: qty 10

## 2020-11-30 MED ORDER — LACTATED RINGERS IV SOLN
INTRAVENOUS | Status: AC | PRN
  Administered 2020-11-30: 1000 mL

## 2020-11-30 MED ORDER — HYDROMORPHONE HCL 1 MG/ML IJ SOLN
0.2500 mg | INTRAMUSCULAR | Status: DC | PRN
Start: 2020-11-30 — End: 2020-11-30
  Administered 2020-11-30 (×2): 0.25 mg via INTRAVENOUS

## 2020-11-30 MED ORDER — MIDAZOLAM HCL 5 MG/5ML IJ SOLN
INTRAMUSCULAR | Status: DC | PRN
  Administered 2020-11-30: 2 mg via INTRAVENOUS

## 2020-11-30 MED ORDER — PHENYLEPHRINE 40 MCG/ML (10ML) SYRINGE FOR IV PUSH (FOR BLOOD PRESSURE SUPPORT)
PREFILLED_SYRINGE | INTRAVENOUS | Status: DC | PRN
  Administered 2020-11-30 (×2): 80 ug via INTRAVENOUS

## 2020-11-30 MED ORDER — PROPOFOL 10 MG/ML IV BOLUS
INTRAVENOUS | Status: DC | PRN
  Administered 2020-11-30: 200 mg via INTRAVENOUS

## 2020-11-30 MED ORDER — OXYCODONE HCL 5 MG PO TABS: 5.0000 mg | ORAL_TABLET | Freq: Once | ORAL | Status: DC | PRN

## 2020-11-30 MED ORDER — HYDROMORPHONE HCL 1 MG/ML IJ SOLN
INTRAMUSCULAR | Status: AC
  Administered 2020-11-30: 0.25 mg via INTRAVENOUS
  Filled 2020-11-30: qty 1

## 2020-11-30 MED ORDER — CHLORHEXIDINE GLUCONATE 0.12 % MT SOLN
15.0000 mL | OROMUCOSAL | Status: AC
  Administered 2020-11-30: 15 mL via OROMUCOSAL

## 2020-11-30 MED ORDER — HYDROCODONE-ACETAMINOPHEN 5-325 MG PO TABS
1.0000 | ORAL_TABLET | Freq: Four times a day (QID) | ORAL | Status: DC | PRN
  Administered 2020-12-01: 1 via ORAL
  Filled 2020-11-30: qty 1

## 2020-11-30 MED ORDER — PROMETHAZINE HCL 25 MG/ML IJ SOLN
6.2500 mg | INTRAMUSCULAR | Status: DC | PRN
Start: 2020-11-30 — End: 2020-11-30

## 2020-11-30 MED ORDER — DEXAMETHASONE SODIUM PHOSPHATE 4 MG/ML IJ SOLN
INTRAMUSCULAR | Status: DC | PRN
  Administered 2020-11-30: 10 mg via INTRAVENOUS

## 2020-11-30 MED ORDER — FENTANYL CITRATE (PF) 100 MCG/2ML IJ SOLN
INTRAMUSCULAR | Status: DC | PRN
  Administered 2020-11-30: 100 ug via INTRAVENOUS
  Administered 2020-11-30: 50 ug via INTRAVENOUS
  Administered 2020-11-30: 100 ug via INTRAVENOUS

## 2020-11-30 MED ORDER — EPHEDRINE SULFATE-NACL 50-0.9 MG/10ML-% IV SOSY
PREFILLED_SYRINGE | INTRAVENOUS | Status: DC | PRN
  Administered 2020-11-30: 5 mg via INTRAVENOUS

## 2020-11-30 MED ORDER — SUGAMMADEX SODIUM 200 MG/2ML IV SOLN
INTRAVENOUS | Status: DC | PRN
  Administered 2020-11-30: 200 mg via INTRAVENOUS

## 2020-11-30 MED ORDER — ROCURONIUM BROMIDE 10 MG/ML (PF) SYRINGE
PREFILLED_SYRINGE | INTRAVENOUS | Status: AC
  Filled 2020-11-30: qty 10

## 2020-11-30 MED ORDER — KETAMINE HCL 10 MG/ML IJ SOLN
INTRAMUSCULAR | Status: AC
  Filled 2020-11-30: qty 1

## 2020-11-30 MED ORDER — ONDANSETRON HCL 4 MG/2ML IJ SOLN
INTRAMUSCULAR | Status: AC
  Filled 2020-11-30: qty 2

## 2020-11-30 MED ORDER — LIDOCAINE 2% (20 MG/ML) 5 ML SYRINGE
INTRAMUSCULAR | Status: DC | PRN
  Administered 2020-11-30: 20 mg via INTRAVENOUS

## 2020-11-30 MED ORDER — KCL IN DEXTROSE-NACL 20-5-0.45 MEQ/L-%-% IV SOLN
INTRAVENOUS | Status: DC
  Filled 2020-11-30 (×5): qty 1000

## 2020-11-30 MED ORDER — ROCURONIUM BROMIDE 10 MG/ML (PF) SYRINGE
PREFILLED_SYRINGE | INTRAVENOUS | Status: DC | PRN
  Administered 2020-11-30: 30 mg via INTRAVENOUS
  Administered 2020-11-30: 70 mg via INTRAVENOUS
  Administered 2020-11-30: 20 mg via INTRAVENOUS

## 2020-11-30 MED ORDER — KETAMINE HCL 10 MG/ML IJ SOLN
INTRAMUSCULAR | Status: DC | PRN
  Administered 2020-11-30: 35 mg via INTRAVENOUS

## 2020-11-30 MED ORDER — OXYCODONE HCL 5 MG/5ML PO SOLN: 5.0000 mg | Freq: Once | ORAL | Status: DC | PRN

## 2020-11-30 MED ORDER — ENSURE SURGERY PO LIQD
237.0000 mL | Freq: Two times a day (BID) | ORAL | Status: DC
  Administered 2020-12-01 – 2020-12-02 (×3): 237 mL via ORAL
  Filled 2020-11-30 (×4): qty 237

## 2020-11-30 MED ORDER — LACTATED RINGERS IV SOLN: INTRAVENOUS | Status: DC | PRN

## 2020-11-30 MED ORDER — 0.9 % SODIUM CHLORIDE (POUR BTL) OPTIME
TOPICAL | Status: DC | PRN
  Administered 2020-11-30: 1000 mL

## 2020-11-30 MED ORDER — HEPARIN SODIUM (PORCINE) 5000 UNIT/ML IJ SOLN
5000.0000 [IU] | Freq: Three times a day (TID) | INTRAMUSCULAR | Status: DC
  Administered 2020-11-30 – 2020-12-02 (×5): 5000 [IU] via SUBCUTANEOUS
  Filled 2020-11-30 (×5): qty 1

## 2020-11-30 MED ORDER — BUPIVACAINE LIPOSOME 1.3 % IJ SUSP
20.0000 mL | Freq: Once | INTRAMUSCULAR | Status: AC
  Administered 2020-11-30: 20 mL
  Filled 2020-11-30: qty 20

## 2020-11-30 MED ORDER — DEXAMETHASONE SODIUM PHOSPHATE 10 MG/ML IJ SOLN
INTRAMUSCULAR | Status: AC
  Filled 2020-11-30: qty 1

## 2020-11-30 MED ORDER — PHENYLEPHRINE HCL-NACL 10-0.9 MG/250ML-% IV SOLN
INTRAVENOUS | Status: DC | PRN
  Administered 2020-11-30: 40 ug/min via INTRAVENOUS

## 2020-11-30 SURGICAL SUPPLY — 75 items
APPLIER CLIP 5 13 M/L LIGAMAX5 (MISCELLANEOUS)
APPLIER CLIP ROT 10 11.4 M/L (STAPLE)
BLADE EXTENDED COATED 6.5IN (ELECTRODE) ×1 IMPLANT
CABLE HIGH FREQUENCY MONO STRZ (ELECTRODE) ×2 IMPLANT
CELLS DAT CNTRL 66122 CELL SVR (MISCELLANEOUS) ×1 IMPLANT
CHLORAPREP W/TINT 26 (MISCELLANEOUS) ×2 IMPLANT
CLIP APPLIE 5 13 M/L LIGAMAX5 (MISCELLANEOUS) IMPLANT
CLIP APPLIE ROT 10 11.4 M/L (STAPLE) IMPLANT
COUNTER NEEDLE 20 DBL MAG RED (NEEDLE) ×2 IMPLANT
COVER MAYO STAND STRL (DRAPES) ×6 IMPLANT
COVER SURGICAL LIGHT HANDLE (MISCELLANEOUS) ×2 IMPLANT
COVER WAND RF STERILE (DRAPES) IMPLANT
DECANTER SPIKE VIAL GLASS SM (MISCELLANEOUS) ×2 IMPLANT
DRAPE LAPAROSCOPIC ABDOMINAL (DRAPES) ×2 IMPLANT
DRSG OPSITE POSTOP 4X10 (GAUZE/BANDAGES/DRESSINGS) IMPLANT
DRSG OPSITE POSTOP 4X6 (GAUZE/BANDAGES/DRESSINGS) IMPLANT
DRSG OPSITE POSTOP 4X8 (GAUZE/BANDAGES/DRESSINGS) ×1 IMPLANT
DRSG TEGADERM 2-3/8X2-3/4 SM (GAUZE/BANDAGES/DRESSINGS) ×1 IMPLANT
ELECT REM PT RETURN 15FT ADLT (MISCELLANEOUS) ×2 IMPLANT
GAUZE SPONGE 2X2 8PLY STRL LF (GAUZE/BANDAGES/DRESSINGS) IMPLANT
GAUZE SPONGE 4X4 12PLY STRL (GAUZE/BANDAGES/DRESSINGS) IMPLANT
GLOVE BIOGEL M 8.0 STRL (GLOVE) ×4 IMPLANT
GOWN STRL REUS W/TWL XL LVL3 (GOWN DISPOSABLE) ×12 IMPLANT
HANDLE STAPLE EGIA 4 XL (STAPLE) ×1 IMPLANT
KIT TURNOVER KIT A (KITS) ×2 IMPLANT
LEGGING LITHOTOMY PAIR STRL (DRAPES) ×1 IMPLANT
LIGASURE IMPACT 36 18CM CVD LR (INSTRUMENTS) ×1 IMPLANT
PACK COLON (CUSTOM PROCEDURE TRAY) ×2 IMPLANT
PAD POSITIONING PINK XL (MISCELLANEOUS) ×2 IMPLANT
PENCIL SMOKE EVACUATOR (MISCELLANEOUS) IMPLANT
PORT LAP GEL ALEXIS MED 5-9CM (MISCELLANEOUS) IMPLANT
PROTECTOR NERVE ULNAR (MISCELLANEOUS) ×4 IMPLANT
RELOAD EGIA 45 MED/THCK PURPLE (STAPLE) ×1 IMPLANT
RELOAD EGIA 60 MED/THCK PURPLE (STAPLE) ×4 IMPLANT
RELOAD PROXIMATE 75MM BLUE (ENDOMECHANICALS) IMPLANT
RELOAD STAPLE 60 MED/THCK ART (STAPLE) IMPLANT
RELOAD STAPLE 75 3.8 BLU REG (ENDOMECHANICALS) IMPLANT
RETRACTOR WND ALEXIS 18 MED (MISCELLANEOUS) IMPLANT
RTRCTR WOUND ALEXIS 18CM MED (MISCELLANEOUS) ×2
SCISSORS LAP 5X45 EPIX DISP (ENDOMECHANICALS) ×2 IMPLANT
SET IRRIG TUBING LAPAROSCOPIC (IRRIGATION / IRRIGATOR) ×2 IMPLANT
SET TUBE SMOKE EVAC HIGH FLOW (TUBING) ×2 IMPLANT
SLEEVE XCEL OPT CAN 5 100 (ENDOMECHANICALS) ×4 IMPLANT
SPONGE GAUZE 2X2 STER 10/PKG (GAUZE/BANDAGES/DRESSINGS) ×1
STAPLER CIRC CVD 29MM 37CM (STAPLE) IMPLANT
STAPLER CVD CUT BL 40 RELOAD (ENDOMECHANICALS) IMPLANT
STAPLER CVD CUT BLU 40 RELOAD (ENDOMECHANICALS) IMPLANT
STAPLER ECHELON POWER CIR 29 (STAPLE) IMPLANT
STAPLER ECHELON POWER CIR 31 (STAPLE) IMPLANT
STAPLER GUN LINEAR PROX 60 (STAPLE) IMPLANT
STAPLER PROXIMATE 75MM BLUE (STAPLE) IMPLANT
STAPLER VISISTAT 35W (STAPLE) ×2 IMPLANT
SUT CHROMIC 3 0 SH 27 (SUTURE) IMPLANT
SUT NOVA NAB DX-16 0-1 5-0 T12 (SUTURE) ×2 IMPLANT
SUT PDS AB 1 CTX 36 (SUTURE) IMPLANT
SUT PDS AB 1 TP1 96 (SUTURE) IMPLANT
SUT PDS AB 4-0 SH 27 (SUTURE) IMPLANT
SUT PROLENE 2 0 KS (SUTURE) IMPLANT
SUT SILK 2 0 (SUTURE) ×1
SUT SILK 2 0 SH CR/8 (SUTURE) ×2 IMPLANT
SUT SILK 2-0 18XBRD TIE 12 (SUTURE) ×1 IMPLANT
SUT SILK 3 0 (SUTURE) ×1
SUT SILK 3 0 SH CR/8 (SUTURE) ×3 IMPLANT
SUT SILK 3-0 18XBRD TIE 12 (SUTURE) ×1 IMPLANT
SUT VIC AB 2-0 SH 27 (SUTURE) ×2
SUT VIC AB 2-0 SH 27X BRD (SUTURE) IMPLANT
SUT VIC AB 4-0 SH 18 (SUTURE) ×2 IMPLANT
SYS LAPSCP GELPORT 120MM (MISCELLANEOUS)
SYSTEM LAPSCP GELPORT 120MM (MISCELLANEOUS) IMPLANT
TOWEL OR NON WOVEN STRL DISP B (DISPOSABLE) ×2 IMPLANT
TRAY FOLEY MTR SLVR 14FR STAT (SET/KITS/TRAYS/PACK) IMPLANT
TRAY FOLEY MTR SLVR 16FR STAT (SET/KITS/TRAYS/PACK) ×1 IMPLANT
TROCAR BLADELESS OPT 5 100 (ENDOMECHANICALS) ×2 IMPLANT
TROCAR XCEL NON-BLD 11X100MML (ENDOMECHANICALS) IMPLANT
TUBING CONNECTING 10 (TUBING) IMPLANT

## 2020-11-30 NOTE — Progress Notes (Signed)
PROGRESS NOTE    Henry Wall  LHT:[id number] DOB: 03-27-1986 DOA: 11/25/2020 PCP: Janith Lima, MD   No chief complaint on file. Brief Narrative: 35 year old male with no significant medical history presented with GI bleeding bright red blood per rectum and found to have malignant mass during colonoscopy, surgery was consulted for resection 3/9  Subjective: Seen and examined this morning family met at the bedside.  Waiting for surgery this morning. Has no new complaints Assessment & Plan:  BRBPR Lower GI bleed Colonic mass Concerning for GI malignancy with associated bleeding.  CT abdomen and CT angio abdomen:negative status post colonoscopy by GI no biopsy obtained, surgery consult for resection planned for today.  CEA 0.9.  Acute blood loss anemia due to #1 hemoglobin stable.  Iron panel normal. Recent Labs  Lab 11/25/20 1943 11/26/20 0504 11/26/20 1706 11/27/20 0508 11/30/20 0535  HGB 12.4* 12.0* 11.9* 11.4* 11.7*  HCT 36.5* 34.9* 34.1* 32.4* 33.1*    Diet Order            Diet NPO time specified Except for: Ice Chips, Sips with Meds  Diet effective ____               Patient's Body mass index is 24.35 kg/m. DVT prophylaxis: SCD's Start: 11/29/20 1204 SCDs Start: 11/25/20 0344 Code Status:   Code Status: Full Code  Family Communication: plan of care discussed with patient at bedside.  Status is: Inpatient Remains inpatient appropriate because:Ongoing diagnostic testing needed not appropriate for outpatient work up and Inpatient level of care appropriate due to severity of illness  Dispo: The patient is from: Home              Anticipated d/c is to: Home once surgery completed and okay with surgery/oncology team              Patient currently is not medically stable to d/c.   Difficult to place patient No    Unresulted Labs (From admission, onward)         None      Medications reviewed:  Scheduled Meds: . alvimopan  12 mg Oral On Call to OR  .  chlorhexidine  15 mL Mouth/Throat NOW   Continuous Infusions: . cefoTEtan (CEFOTAN) IV    . lactated ringers      Consultants:see note  Procedures:see note  Antimicrobials: Anti-infectives (From admission, onward)   Start     Dose/Rate Route Frequency Ordered Stop   11/30/20 0600  cefoTEtan (CEFOTAN) 2 g in sodium chloride 0.9 % 100 mL IVPB        2 g 200 mL/hr over 30 Minutes Intravenous On call to O.R. 11/29/20 1203 12/01/20 0559   11/29/20 0830  cefoTEtan (CEFOTAN) 2 g in sodium chloride 0.9 % 100 mL IVPB  Status:  Discontinued       Note to Pharmacy: Pharmacy may adjust dose strength for optimal dosing.   Send with patient on call to the OR.  Anesthesia to complete antibiotic administration <70mn [facility]   2 g 200 mL/hr over 30 Minutes Intravenous On call to O.R. 11/29/20 0740 11/29/20 0745     Culture/Microbiology No results found for: SDES, SPECREQUEST, CULT, REPTSTATUS  Other culture-see note  Objective: Vitals: Today's Vitals   11/29/20 2005 11/30/20 0404 11/30/20 0812 11/30/20 1043  BP: (!) 111/58 113/74  120/63  Pulse: (!) 59 66  81  Resp: 16 16  16   Temp: 98.2 F (36.8 C) 98 F (  36.7 C)    TempSrc: Oral Oral    SpO2: 100% 99%  100%  Weight:    74.8 kg  Height:    5' 9"  (1.753 m)  PainSc:   0-No pain 0-No pain   No intake or output data in the 24 hours ending 11/30/20 1048 Filed Weights   11/27/20 0116 11/30/20 1043  Weight: 74.8 kg 74.8 kg   Weight change:   Intake/Output from previous day: No intake/output data recorded. Intake/Output this shift: No intake/output data recorded. Filed Weights   11/27/20 0116 11/30/20 1043  Weight: 74.8 kg 74.8 kg    Examination: General exam: AAOx3, young,NAD, weak appearing. HEENT:Oral mucosa moist, Ear/Nose WNL grossly,dentition normal. Respiratory system: bilaterally diminished,no use of accessory muscle, non tender. Cardiovascular system: S1 & S2 +, regular, No  JVD. Gastrointestinal system: Abdomen soft, NT,ND, BS+. Nervous System:Alert, awake, moving extremities and grossly nonfocal Extremities: No edema, distal peripheral pulses palpable.  Skin: No rashes,no icterus. MSK: Normal muscle bulk,tone, power  Data Reviewed: I have personally reviewed following labs and imaging studies CBC: Recent Labs  Lab 11/25/20 0209 11/25/20 1943 11/26/20 0504 11/26/20 1706 11/27/20 0508 11/30/20 0535  WBC 10.9*  --  5.0 11.4* 5.3 5.0  NEUTROABS 8.7*  --   --   --   --   --   HGB 13.2 12.4* 12.0* 11.9* 11.4* 11.7*  HCT 38.5* 36.5* 34.9* 34.1* 32.4* 33.1*  MCV 96.3  --  95.9 95.0 95.6 93.5  PLT 158  --  152 160 142* 196   Basic Metabolic Panel: Recent Labs  Lab 11/24/20 1450 11/25/20 0209 11/26/20 0504 11/30/20 0535  NA 139 138 138 137  K 3.3* 3.7 3.9 3.5  CL 104 105 102 103  CO2 28 22 28 27   GLUCOSE 142* 115* 90 206*  BUN 11 13 8 7   CREATININE 0.83 0.92 0.85 0.84  CALCIUM 9.3 9.1 9.0 9.0   GFR: Estimated Creatinine Clearance: 121.6 mL/min (by C-G formula based on SCr of 0.84 mg/dL). Liver Function Tests: Recent Labs  Lab 11/24/20 1450 11/25/20 0209  AST 21 16  ALT 18 16  ALKPHOS 48 46  BILITOT 1.1 0.9  PROT 7.4 6.6  ALBUMIN 4.8 4.2   No results for input(s): LIPASE, AMYLASE in the last 168 hours. No results for input(s): AMMONIA in the last 168 hours. Coagulation Profile: Recent Labs  Lab 11/25/20 0209  INR 1.1   Cardiac Enzymes: No results for input(s): CKTOTAL, CKMB, CKMBINDEX, TROPONINI in the last 168 hours. BNP (last 3 results) No results for input(s): PROBNP in the last 8760 hours. HbA1C: No results for input(s): HGBA1C in the last 72 hours. CBG: No results for input(s): GLUCAP in the last 168 hours. Lipid Profile: No results for input(s): CHOL, HDL, LDLCALC, TRIG, CHOLHDL, LDLDIRECT in the last 72 hours. Thyroid Function Tests: No results for input(s): TSH, T4TOTAL, FREET4, T3FREE, THYROIDAB in the last 72  hours. Anemia Panel: No results for input(s): VITAMINB12, FOLATE, FERRITIN, TIBC, IRON, RETICCTPCT in the last 72 hours. Sepsis Labs: No results for input(s): PROCALCITON, LATICACIDVEN in the last 168 hours.  Recent Results (from the past 240 hour(s))  Resp Panel by RT-PCR (Flu A&B, Covid) Nasopharyngeal Swab     Status: None   Collection Time: 11/25/20  2:10 AM   Specimen: Nasopharyngeal Swab; Nasopharyngeal(NP) swabs in vial transport medium  Result Value Ref Range Status   SARS Coronavirus 2 by RT PCR NEGATIVE NEGATIVE Final    Comment: (NOTE) SARS-CoV-2 target  nucleic acids are NOT DETECTED.  The SARS-CoV-2 RNA is generally detectable in upper respiratory specimens during the acute phase of infection. The lowest concentration of SARS-CoV-2 viral copies this assay can detect is 138 copies/mL. A negative result does not preclude SARS-Cov-2 infection and should not be used as the sole basis for treatment or other patient management decisions. A negative result may occur with  improper specimen collection/handling, submission of specimen other than nasopharyngeal swab, presence of viral mutation(s) within the areas targeted by this assay, and inadequate number of viral copies(<138 copies/mL). A negative result must be combined with clinical observations, patient history, and epidemiological information. The expected result is Negative.  Fact Sheet for Patients:  EntrepreneurPulse.com.au  Fact Sheet for Healthcare Providers:  IncredibleEmployment.be  This test is no t yet approved or cleared by the [place] FDA and  has been authorized for detection and/or diagnosis of SARS-CoV-2 by FDA under an Emergency Use Authorization (EUA). This EUA will remain  in effect (meaning this test can be used) for the duration of the COVID-19 declaration under Section 564(b)(1) of the Act, 21 U.S.C.section 360bbb-3(b)(1), unless the authorization is  terminated  or revoked sooner.       Influenza A by PCR NEGATIVE NEGATIVE Final   Influenza B by PCR NEGATIVE NEGATIVE Final    Comment: (NOTE) The Xpert Xpress SARS-CoV-2/FLU/RSV plus assay is intended as an aid in the diagnosis of influenza from Nasopharyngeal swab specimens and should not be used as a sole basis for treatment. Nasal washings and aspirates are unacceptable for Xpert Xpress SARS-CoV-2/FLU/RSV testing.  Fact Sheet for Patients: EntrepreneurPulse.com.au  Fact Sheet for Healthcare Providers: IncredibleEmployment.be  This test is not yet approved or cleared by the [place] FDA and has been authorized for detection and/or diagnosis of SARS-CoV-2 by FDA under an Emergency Use Authorization (EUA). This EUA will remain in effect (meaning this test can be used) for the duration of the COVID-19 declaration under Section 564(b)(1) of the Act, 21 U.S.C. section 360bbb-3(b)(1), unless the authorization is terminated or revoked.  Performed at [facility], [place] [address] [place], [place] [address]      Radiology Studies: No results found.   LOS: 3 days   Antonieta Pert, MD [facility]  11/30/2020, 10:48 AM

## 2020-11-30 NOTE — Op Note (Signed)
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  After anesthesia was administered and the patient was prepped  with chloroprep  and a timeout was performed.  Access to the abdomen was achieved with a 5 mm Optiview through the left upper quadrant.  A total of 4 5 mm ports were placed to enable [place] to visualize the pelvis and see the 2 areas that have been tattooed.  The left colon was mobilized by incising the white line of [place].  The colon was fairly mobile and the lesion was in the middle of the sigmoid.  We were able to see the ureters with the laparoscope both sides and because the tumor seem to be right under her trocar that was in the midline below the umbilicus we went ahead and opened that and enter the abdomen to about a 9 cm incision in the midline.  Wound protector was placed.  The the colon with the palpable mass could be brought up into the wound.  We went distally beyond the tattoo which was about at least a 5 cm gross margin and decided to: With the [employer] with a purple load.  We went proximally and divided the bowel above again with the tristaple Covidien purple load.  The mesentery was scored and then divided again repeat in the identification of the ureters on both the right and left side.  The mesentery was divided with the LigaSure.  The arterial inflow was ligated with a 2-0 silk tie below the ligature on the staying side.  Specimen was marked with the distal end of the silk and was sent to the pathologist to  examine it and had negative margins and he said grossly look like a cancer.  I handsewn side to and anastomosis was done using the back row of silk sutures 3 oh placed along the back seromuscular layer of the distal colon sutured into the tenia of the antimesenteric border of the descending or proximal sigmoid.  Once a back row was found an opening was made along the tenia and also the staple line was removed from the distal segment.  The mucosal mucosa to mucosa anastomosis was created using a running locking suture of 4-0 PDS carried anteriorly in a canal Mayo fashion tied in the middle.  Second layer of seromuscular 3 oh silks were placed anteriorly to complete the anastomosis.  Proctoscope was inserted from below and visualized the area of the anastomosis.  No active bleeding was seen.  We blew it up with air and with the clamp on and under water there were no bubbles seen.  Sponge and needle counts were reported as correct.  We then closed the abdomen after changing gowns and gloves with a running 2-0 Vicryl in the peritoneum.  The fascia was closed interrupted figure-of-eight sutures of #1 Novafil.  Exparel was injected into the fascial and below laterally on both sides.  The skin was closed with a stapler.  The patient was taken recovery  in satisfactory addition.  The patient tolerated the procedure well and was taken to the PACU in stable condition.     Matt B. Hassell Done, [place], [facility], [place]

## 2020-11-30 NOTE — Anesthesia Preprocedure Evaluation (Addendum)
Anesthesia Evaluation  Patient identified by MRN, date of birth, ID band Patient awake    Reviewed: Allergy & Precautions, NPO status , Patient's Chart, lab work & pertinent test results  History of Anesthesia Complications Negative for: history of anesthetic complications  Airway Mallampati: II  TM Distance: >3 FB Neck ROM: Full    Dental  (+) Dental Advisory Given, Chipped   Pulmonary neg pulmonary ROS,  11/25/2020 SARS coronavirus NEG   breath sounds clear to auscultation       Cardiovascular negative cardio ROS   Rhythm:Regular Rate:Normal     Neuro/Psych negative neurological ROS     GI/Hepatic Neg liver ROS, Sigmoid colon mass   Endo/Other  negative endocrine ROS  Renal/GU negative Renal ROS     Musculoskeletal   Abdominal   Peds  Hematology  (+) Blood dyscrasia (Hb 11.7), anemia ,   Anesthesia Other Findings   Reproductive/Obstetrics                            Anesthesia Physical Anesthesia Plan  ASA: II  Anesthesia Plan: General   Post-op Pain Management:    Induction: Intravenous  PONV Risk Score and Plan: 2 and Ondansetron and Dexamethasone  Airway Management Planned: Oral ETT  Additional Equipment: None  Intra-op Plan:   Post-operative Plan: Extubation in OR  Informed Consent: I have reviewed the patients History and Physical, chart, labs and discussed the procedure including the risks, benefits and alternatives for the proposed anesthesia with the patient or authorized representative who has indicated his/her understanding and acceptance.     Dental advisory given  Plan Discussed with: CRNA and Surgeon  Anesthesia Plan Comments:        Anesthesia Quick Evaluation

## 2020-11-30 NOTE — Transfer of Care (Signed)
Immediate Anesthesia Transfer of Care Note  Patient: Henry Wall  Procedure(s) Performed: LAPAROSCOPIC ASSISTED  PARTIAL COLECTOMY (N/A ) sigmoidectomy (N/A )  Patient Location: PACU  Anesthesia Type:General  Level of Consciousness: drowsy and patient cooperative  Airway & Oxygen Therapy: Patient Spontanous Breathing and Patient connected to face mask oxygen  Post-op Assessment: Report given to RN and Post -op Vital signs reviewed and stable  Post vital signs: Reviewed and stable  Last Vitals:  Vitals Value Taken Time  BP 126/59 11/30/20 1515  Temp    Pulse 104 11/30/20 1517  Resp 14 11/30/20 1517  SpO2 97 % 11/30/20 1517  Vitals shown include unvalidated device data.  Last Pain:  Vitals:   11/30/20 1043  TempSrc:   PainSc: 0-No pain      Patients Stated Pain Goal: 0 (83/16/74 2552)  Complications: No complications documented.

## 2020-11-30 NOTE — Interval H&P Note (Signed)
History and Physical Interval Note:  11/30/2020 10:48 AM  Henry Wall  has presented today for surgery, with the diagnosis of SIGMOID COLON MASS.  The various methods of treatment have been discussed with the patient and family. After consideration of risks, benefits and other options for treatment, the patient has consented to  Procedure(s): LAPAROSCOPIC ASSISTED  PARTIAL COLECTOMY (N/A) POSSIBLE COLOSTOMY (N/A) as a surgical intervention.  The patient's history has been reviewed, patient examined, no change in status, stable for surgery.  I have reviewed the patient's chart and labs.  Questions were answered to the patient's satisfaction.     Pedro Earls

## 2020-12-01 ENCOUNTER — Telehealth: Payer: Self-pay

## 2020-12-01 ENCOUNTER — Encounter (HOSPITAL_COMMUNITY): Payer: Self-pay | Admitting: Surgery

## 2020-12-01 DIAGNOSIS — K625 Hemorrhage of anus and rectum: Secondary | ICD-10-CM | POA: Diagnosis not present

## 2020-12-01 LAB — COMPREHENSIVE METABOLIC PANEL
ALT: 12 U/L (ref 0–44)
AST: 16 U/L (ref 15–41)
Albumin: 3.5 g/dL (ref 3.5–5.0)
Alkaline Phosphatase: 36 U/L — ABNORMAL LOW (ref 38–126)
Anion gap: 8 (ref 5–15)
BUN: 6 mg/dL (ref 6–20)
CO2: 26 mmol/L (ref 22–32)
Calcium: 8.8 mg/dL — ABNORMAL LOW (ref 8.9–10.3)
Chloride: 104 mmol/L (ref 98–111)
Creatinine, Ser: 0.92 mg/dL (ref 0.61–1.24)
GFR, Estimated: >60 mL/min (ref >60)
Glucose, Bld: 135 mg/dL — ABNORMAL HIGH (ref 70–99)
Potassium: 3.8 mmol/L (ref 3.5–5.1)
Sodium: 138 mmol/L (ref 135–145)
Total Bilirubin: 1 mg/dL (ref 0.3–1.2)
Total Protein: 5.7 g/dL — ABNORMAL LOW (ref 6.5–8.1)

## 2020-12-01 LAB — CBC
HCT: 31.5 % — ABNORMAL LOW (ref 39.0–52.0)
Hemoglobin: 11.2 g/dL — ABNORMAL LOW (ref 13.0–17.0)
MCH: 33.7 pg (ref 26.0–34.0)
MCHC: 35.6 g/dL (ref 30.0–36.0)
MCV: 94.9 fL (ref 80.0–100.0)
Platelets: 174 10*3/uL (ref 150–400)
RBC: 3.32 MIL/uL — ABNORMAL LOW (ref 4.22–5.81)
RDW: 11.8 % (ref 11.5–15.5)
WBC: 11.4 10*3/uL — ABNORMAL HIGH (ref 4.0–10.5)
nRBC: 0 % (ref 0.0–0.2)

## 2020-12-01 MED ORDER — OXYCODONE HCL 5 MG PO TABS
5.0000 mg | ORAL_TABLET | ORAL | Status: DC | PRN
  Administered 2020-12-01 – 2020-12-02 (×2): 5 mg via ORAL
  Filled 2020-12-01 (×2): qty 1

## 2020-12-01 NOTE — Anesthesia Postprocedure Evaluation (Signed)
Anesthesia Post Note  Patient: Henry Wall  Procedure(s) Performed: LAPAROSCOPIC ASSISTED  PARTIAL COLECTOMY (N/A ) sigmoidectomy (N/A )     Patient location during evaluation: PACU Anesthesia Type: General Level of consciousness: awake and alert and oriented Pain management: pain level controlled Vital Signs Assessment: post-procedure vital signs reviewed and stable Respiratory status: spontaneous breathing, nonlabored ventilation and respiratory function stable Cardiovascular status: blood pressure returned to baseline Postop Assessment: no apparent nausea or vomiting Anesthetic complications: no   No complications documented.             Brennan Bailey

## 2020-12-01 NOTE — Telephone Encounter (Signed)
-----   Message from Nelida Meuse III, MD sent at 11/30/2020  5:25 PM EST ----- Regarding: recall and update Henry Wall,   I saw this patient in the hospital last week for lower GI bleeding, and we discovered a malignant appearing mass in the sigmoid colon, for which he went to the operating room today.  Final pathology pending.  Please set a colonoscopy recall for 1 year.  _________________________________________  Dr. Adriana Simas on a primary care patient of yours.   Wilfrid Lund, MD    [facility]

## 2020-12-01 NOTE — Progress Notes (Signed)
PROGRESS NOTE    Henry Wall  VEL:[id number] DOB: 26-Nov-1985 DOA: 11/25/2020 PCP: Janith Lima, MD   No chief complaint on file. Brief Narrative: 35 year old male with no significant medical history presented with GI bleeding bright red blood per rectum and found to have malignant mass during colonoscopy, surgery was consulted S/P resection 3/9  Subjective: Seen and examined this morning family met at the bedside.  Waiting for surgery this morning. Has no new complaints Assessment & Plan:  BRBPR/Lower GI bleed Colonic mass Concerning for GI malignancy with associated bleeding.  CT abdomen and CT angio abdomen:negative status post colonoscopy by GI 3/5-no biopsy obtained, CCS consulted-s/p laparoscopic-assisted sigmoid colectomy 3/9 by Dr. Everlene Balls pending.  CEA 0.9.  Small BM with blood-tinged stool, continue on liquid diet as per surgery continue pain control.  Acute blood loss anemia due to #1 hemoglobin remains stable.Iron panel normal. Recent Labs  Lab 11/26/20 0504 11/26/20 1706 11/27/20 0508 11/30/20 0535 12/01/20 0547  HGB 12.0* 11.9* 11.4* 11.7* 11.2*  HCT 34.9* 34.1* 32.4* 33.1* 31.5*    Diet Order            Diet full liquid Room service appropriate? Yes; Fluid consistency: Thin  Diet effective now               Patient's Body mass index is 24.35 kg/m. DVT prophylaxis: heparin injection 5,000 Units Start: 11/30/20 2200 SCD's Start: 11/30/20 1816 Place TED hose Start: 11/30/20 1816 SCDs Start: 11/25/20 0344 Code Status:   Code Status: Full Code  Family Communication: plan of care discussed with patient and his family at bedside.  Status is: Inpatient Remains inpatient appropriate because:Ongoing diagnostic testing needed not appropriate for outpatient work up and Inpatient level of care appropriate due to severity of illness  Dispo: The patient is from: Home              Anticipated d/c is to: Home likely tomorrow once cleared by surgery.                 Patient currently is not medically stable to d/c.   Difficult to place patient No    Unresulted Labs (From admission, onward)          Start     Ordered   Signed and Occupational hygienist morning,   R       Question:  Specimen collection method  Answer:  Lab=Lab collect   Signed and Held          Medications reviewed:  Scheduled Meds: . feeding supplement  237 mL Oral BID BM  . heparin injection (subcutaneous)  5,000 Units Subcutaneous Q8H   Continuous Infusions: . dextrose 5 % and 0.45 % NaCl with KCl 20 mEq/L 100 mL/hr at 12/01/20 0553    Consultants:see note  Procedures:see note  Antimicrobials: Anti-infectives (From admission, onward)   Start     Dose/Rate Route Frequency Ordered Stop   11/30/20 0600  cefoTEtan (CEFOTAN) 2 g in sodium chloride 0.9 % 100 mL IVPB        2 g 200 mL/hr over 30 Minutes Intravenous On call to O.R. 11/29/20 1203 11/30/20 1209   11/29/20 0830  cefoTEtan (CEFOTAN) 2 g in sodium chloride 0.9 % 100 mL IVPB  Status:  Discontinued       Note to Pharmacy: Pharmacy may adjust dose strength for optimal dosing.   Send with patient on call to the OR.  Anesthesia to complete antibiotic administration <61mn  prior to incision per [facility].   2 g 200 mL/hr over 30 Minutes Intravenous On call to O.R. 11/29/20 0740 11/29/20 0745     Culture/Microbiology No results found for: SDES, SPECREQUEST, CULT, REPTSTATUS  Other culture-see note  Objective: Vitals: Today's Vitals   12/01/20 0447 12/01/20 0513 12/01/20 0555 12/01/20 0730  BP: (!) 100/58     Pulse: 78     Resp: 14     Temp: 98.6 F (37 C)     TempSrc: Oral     SpO2: 100%     Weight:      Height:      PainSc:  6  2  2      Intake/Output Summary (Last 24 hours) at 12/01/2020 1245 Last data filed at 12/01/2020 0453 Gross per 24 hour  Intake 3026.21 ml  Output 2845 ml  Net 181.21 ml   Filed Weights   11/27/20 0116 11/30/20 1043  Weight: 74.8 kg 74.8 kg    Weight change:   Intake/Output from previous day: 03/09 0701 - 03/10 0700 In: 3126.2 [I.V.:3026.2; IV Piggyback:100] Out: 2845 [Urine:2825; Blood:20] Intake/Output this shift: No intake/output data recorded. Filed Weights   11/27/20 0116 11/30/20 1043  Weight: 74.8 kg 74.8 kg    Examination: General exam: AAO,NAD, weak appearing. HEENT:Oral mucosa moist, Ear/Nose WNL grossly, dentition normal. Respiratory system: bilaterally diminishedd,no wheezing or crackles,no use of accessory muscle Cardiovascular system: S1 & S2 +, No JVD,. Gastrointestinal system: Abdomen soft, midline incision clean dry intact with staples and honeycomb in place Nervous System:Alert, awake, moving extremities and grossly nonfocal Extremities: No edema, distal peripheral pulses palpable.  Skin: No rashes,no icterus. MSK: Normal muscle bulk,tone, power    Data Reviewed: I have personally reviewed following labs and imaging studies CBC: Recent Labs  Lab 11/25/20 0209 11/25/20 1943 11/26/20 0504 11/26/20 1706 11/27/20 0508 11/30/20 0535 12/01/20 0547  WBC 10.9*  --  5.0 11.4* 5.3 5.0 11.4*  NEUTROABS 8.7*  --   --   --   --   --   --   HGB 13.2   < > 12.0* 11.9* 11.4* 11.7* 11.2*  HCT 38.5*   < > 34.9* 34.1* 32.4* 33.1* 31.5*  MCV 96.3  --  95.9 95.0 95.6 93.5 94.9  PLT 158  --  152 160 142* 175 174   < > = values in this interval not displayed.   Basic Metabolic Panel: Recent Labs  Lab 11/24/20 1450 11/25/20 0209 11/26/20 0504 11/30/20 0535 12/01/20 0547  NA 139 138 138 137 138  K 3.3* 3.7 3.9 3.5 3.8  CL 104 105 102 103 104  CO2 28 22 28 27 26   GLUCOSE 142* 115* 90 206* 135*  BUN 11 13 8 7 6   CREATININE 0.83 0.92 0.85 0.84 0.92  CALCIUM 9.3 9.1 9.0 9.0 8.8*   GFR: Estimated Creatinine Clearance: 111 mL/min (by C-G formula based on SCr of 0.92 mg/dL). Liver Function Tests: Recent Labs  Lab 11/24/20 1450 11/25/20 0209 12/01/20 0547  AST 21 16 16   ALT 18 16 12   ALKPHOS 48  46 36*  BILITOT 1.1 0.9 1.0  PROT 7.4 6.6 5.7*  ALBUMIN 4.8 4.2 3.5   No results for input(s): LIPASE, AMYLASE in the last 168 hours. No results for input(s): AMMONIA in the last 168 hours. Coagulation Profile: Recent Labs  Lab 11/25/20 0209  INR 1.1   Cardiac Enzymes: No results for input(s): CKTOTAL, CKMB, CKMBINDEX, TROPONINI in the last 168 hours. BNP (last 3  results) No results for input(s): PROBNP in the last 8760 hours. HbA1C: No results for input(s): HGBA1C in the last 72 hours. CBG: No results for input(s): GLUCAP in the last 168 hours. Lipid Profile: No results for input(s): CHOL, HDL, LDLCALC, TRIG, CHOLHDL, LDLDIRECT in the last 72 hours. Thyroid Function Tests: No results for input(s): TSH, T4TOTAL, FREET4, T3FREE, THYROIDAB in the last 72 hours. Anemia Panel: No results for input(s): VITAMINB12, FOLATE, FERRITIN, TIBC, IRON, RETICCTPCT in the last 72 hours. Sepsis Labs: No results for input(s): PROCALCITON, LATICACIDVEN in the last 168 hours.  Recent Results (from the past 240 hour(s))  Resp Panel by RT-PCR (Flu A&B, Covid) Nasopharyngeal Swab     Status: None   Collection Time: 11/25/20  2:10 AM   Specimen: Nasopharyngeal Swab; Nasopharyngeal(NP) swabs in vial transport medium  Result Value Ref Range Status   SARS Coronavirus 2 by RT PCR NEGATIVE NEGATIVE Final    Comment: (NOTE) SARS-CoV-2 target nucleic acids are NOT DETECTED.  The SARS-CoV-2 RNA is generally detectable in upper respiratory specimens during the acute phase of infection. The lowest concentration of SARS-CoV-2 viral copies this assay can detect is 138 copies/mL. A negative result does not preclude SARS-Cov-2 infection and should not be used as the sole basis for treatment or other patient management decisions. A negative result may occur with  improper specimen collection/handling, submission of specimen other than nasopharyngeal swab, presence of viral mutation(s) within the areas  targeted by this assay, and inadequate number of viral copies(<138 copies/mL). A negative result must be combined with clinical observations, patient history, and epidemiological information. The expected result is Negative.  Fact Sheet for Patients:  EntrepreneurPulse.com.au  Fact Sheet for Healthcare Providers:  IncredibleEmployment.be  This test is no t yet approved or cleared by the [place] FDA and  has been authorized for detection and/or diagnosis of SARS-CoV-2 by FDA under an Emergency Use Authorization (EUA). This EUA will remain  in effect (meaning this test can be used) for the duration of the COVID-19 declaration under Section 564(b)(1) of the Act, 21 U.S.C.section 360bbb-3(b)(1), unless the authorization is terminated  or revoked sooner.       Influenza A by PCR NEGATIVE NEGATIVE Final   Influenza B by PCR NEGATIVE NEGATIVE Final    Comment: (NOTE) The Xpert Xpress SARS-CoV-2/FLU/RSV plus assay is intended as an aid in the diagnosis of influenza from Nasopharyngeal swab specimens and should not be used as a sole basis for treatment. Nasal washings and aspirates are unacceptable for Xpert Xpress SARS-CoV-2/FLU/RSV testing.  Fact Sheet for Patients: EntrepreneurPulse.com.au  Fact Sheet for Healthcare Providers: IncredibleEmployment.be  This test is not yet approved or cleared by the [place] FDA and has been authorized for detection and/or diagnosis of SARS-CoV-2 by FDA under an Emergency Use Authorization (EUA). This EUA will remain in effect (meaning this test can be used) for the duration of the COVID-19 declaration under Section 564(b)(1) of the Act, 21 U.S.C. section 360bbb-3(b)(1), unless the authorization is terminated or revoked.  Performed at [facility], [place] [address] [place], [place] [address]      Radiology Studies: No results found.   LOS: 4  days   Antonieta Pert, MD [facility]  12/01/2020, 12:45 PM

## 2020-12-01 NOTE — Telephone Encounter (Signed)
1 year hospital colon recall in epic. 

## 2020-12-01 NOTE — Progress Notes (Signed)
[facility] Progress Note  1 Day Post-Op  Subjective: CC:  No complaints. Abdominal pain currently controlled with PO pain meds. Does c/o bilateral shoulder pain. Having flatus and some small BMs that are blood-tinged per rectum. Tolerating CLD without nausea or vomiting. Mobilizing in his room.   Objective: Vital signs in last 24 hours: Temp:  [98 F (36.7 C)-98.7 F (37.1 C)] 98.6 F (37 C) (03/10 0447) Pulse Rate:  [78-103] 78 (03/10 0447) Resp:  [12-22] 14 (03/10 0447) BP: (100-128)/(58-65) 100/58 (03/10 0447) SpO2:  [96 %-100 %] 100 % (03/10 0447) Last BM Date: 11/29/20  Intake/Output from previous day: 03/09 0701 - 03/10 0700 In: 3126.2 [I.V.:3026.2; IV Piggyback:100] Out: 2845 [Urine:2825; Blood:20] Intake/Output this shift: No intake/output data recorded.  PE: Gen:  Alert, NAD, pleasant Abd: Soft, approp tender, +BS, midline incision c/d/i with staples and honeycomb in place. Trochar sites c/d/i. Skin: warm and dry, no rashes  Psych: A&Ox3   Lab Results:  Recent Labs    11/30/20 0535 12/01/20 0547  WBC 5.0 11.4*  HGB 11.7* 11.2*  HCT 33.1* 31.5*  PLT 175 174   BMET Recent Labs    11/30/20 0535 12/01/20 0547  NA 137 138  K 3.5 3.8  CL 103 104  CO2 27 26  GLUCOSE 206* 135*  BUN 7 6  CREATININE 0.84 0.92  CALCIUM 9.0 8.8*   PT/INR No results for input(s): LABPROT, INR in the last 72 hours. CMP     Component Value Date/Time   NA 138 12/01/2020 0547   K 3.8 12/01/2020 0547   CL 104 12/01/2020 0547   CO2 26 12/01/2020 0547   GLUCOSE 135 (H) 12/01/2020 0547   BUN 6 12/01/2020 0547   CREATININE 0.92 12/01/2020 0547   CALCIUM 8.8 (L) 12/01/2020 0547   PROT 5.7 (L) 12/01/2020 0547   ALBUMIN 3.5 12/01/2020 0547   AST 16 12/01/2020 0547   ALT 12 12/01/2020 0547   ALKPHOS 36 (L) 12/01/2020 0547   BILITOT 1.0 12/01/2020 0547   GFRNONAA >60 12/01/2020 0547   Lipase  No results found for: LIPASE     Studies/Results: No results  found.  Anti-infectives: Anti-infectives (From admission, onward)   Start     Dose/Rate Route Frequency Ordered Stop   11/30/20 0600  cefoTEtan (CEFOTAN) 2 g in sodium chloride 0.9 % 100 mL IVPB        2 g 200 mL/hr over 30 Minutes Intravenous On call to O.R. 11/29/20 1203 11/30/20 1209   11/29/20 0830  cefoTEtan (CEFOTAN) 2 g in sodium chloride 0.9 % 100 mL IVPB  Status:  Discontinued       Note to Pharmacy: Pharmacy may adjust dose strength for optimal dosing.   Send with patient on call to the OR.  Anesthesia to complete antibiotic administration <47min prior to incision per [facility].   2 g 200 mL/hr over 30 Minutes Intravenous On call to O.R. 11/29/20 0740 11/29/20 0745       Assessment/Plan BRBPR Distal colonic mass  S/p laparoscopic assisted sigmoid colectomy 12/01/2019 Dr. Hassell Done - await surgical path. - s/p colonoscopy 11/26/20 by Dr. Loletha Carrow, malignant appearing mass, partially obstructing, no Bx obtained - CEA 0.9 - Allow FLD, saline lock IV - monitor BMs tonight, some minor bleeding or old blood from colonic suture line post-operatively is not uncommon.  - possible discharge home tomorrow 3/11     LOS: 4 days    Obie Dredge, [facility] Please see Amion  for pager number during day hours 7:00am-4:30pm

## 2020-12-02 ENCOUNTER — Other Ambulatory Visit: Payer: Self-pay | Admitting: Oncology

## 2020-12-02 DIAGNOSIS — C189 Malignant neoplasm of colon, unspecified: Secondary | ICD-10-CM

## 2020-12-02 DIAGNOSIS — K625 Hemorrhage of anus and rectum: Secondary | ICD-10-CM | POA: Diagnosis not present

## 2020-12-02 LAB — CBC
HCT: 31.1 % — ABNORMAL LOW (ref 39.0–52.0)
Hemoglobin: 10.5 g/dL — ABNORMAL LOW (ref 13.0–17.0)
MCH: 32.6 pg (ref 26.0–34.0)
MCHC: 33.8 g/dL (ref 30.0–36.0)
MCV: 96.6 fL (ref 80.0–100.0)
Platelets: 169 10*3/uL (ref 150–400)
RBC: 3.22 MIL/uL — ABNORMAL LOW (ref 4.22–5.81)
RDW: 12 % (ref 11.5–15.5)
WBC: 8.4 10*3/uL (ref 4.0–10.5)
nRBC: 0 % (ref 0.0–0.2)

## 2020-12-02 MED ORDER — ENSURE SURGERY PO LIQD: 237.0000 mL | Freq: Two times a day (BID) | ORAL | 0 refills | Status: AC

## 2020-12-02 MED ORDER — ACETAMINOPHEN 325 MG PO TABS: 650.0000 mg | ORAL_TABLET | Freq: Four times a day (QID) | ORAL | Status: DC | PRN

## 2020-12-02 MED ORDER — OXYCODONE HCL 5 MG PO TABS: 5.0000 mg | ORAL_TABLET | Freq: Four times a day (QID) | ORAL | 0 refills | Status: DC | PRN

## 2020-12-02 NOTE — Progress Notes (Signed)
[facility] Progress Note  2 Days Post-Op  Subjective: CC:  States he feels better- shoulder pain better. Abdominal pain controlled. Tolerating liquids. Having non-bloody BMs.  Objective: Vital signs in last 24 hours: Temp:  [98.1 F (36.7 C)-99.3 F (37.4 C)] 98.7 F (37.1 C) (03/11 0618) Pulse Rate:  [72-81] 77 (03/11 0618) Resp:  [14-16] 14 (03/11 0618) BP: (91-116)/(64-69) 91/64 (03/11 0618) SpO2:  [97 %-100 %] 97 % (03/11 0618) Last BM Date: 11/29/20  Intake/Output from previous day: 03/10 0701 - 03/11 0700 In: 520 [P.O.:520] Out: -  Intake/Output this shift: No intake/output data recorded.  PE: Gen:  Alert, NAD, pleasant Abd: Soft, approp tender, +BS, midline incision c/d/i with staples and honeycomb in place. Trochar sites c/d/i. Skin: warm and dry, no rashes  Psych: A&Ox3   Lab Results:  Recent Labs    12/01/20 0547 12/02/20 0548  WBC 11.4* 8.4  HGB 11.2* 10.5*  HCT 31.5* 31.1*  PLT 174 169   BMET Recent Labs    11/30/20 0535 12/01/20 0547  NA 137 138  K 3.5 3.8  CL 103 104  CO2 27 26  GLUCOSE 206* 135*  BUN 7 6  CREATININE 0.84 0.92  CALCIUM 9.0 8.8*   CMP     Component Value Date/Time   NA 138 12/01/2020 0547   K 3.8 12/01/2020 0547   CL 104 12/01/2020 0547   CO2 26 12/01/2020 0547   GLUCOSE 135 (H) 12/01/2020 0547   BUN 6 12/01/2020 0547   CREATININE 0.92 12/01/2020 0547   CALCIUM 8.8 (L) 12/01/2020 0547   PROT 5.7 (L) 12/01/2020 0547   ALBUMIN 3.5 12/01/2020 0547   AST 16 12/01/2020 0547   ALT 12 12/01/2020 0547   ALKPHOS 36 (L) 12/01/2020 0547   BILITOT 1.0 12/01/2020 0547   GFRNONAA >60 12/01/2020 0547   Anti-infectives: Anti-infectives (From admission, onward)   Start     Dose/Rate Route Frequency Ordered Stop   11/30/20 0600  cefoTEtan (CEFOTAN) 2 g in sodium chloride 0.9 % 100 mL IVPB        2 g 200 mL/hr over 30 Minutes Intravenous On call to O.R. 11/29/20 1203 11/30/20 1209   11/29/20 0830  cefoTEtan  (CEFOTAN) 2 g in sodium chloride 0.9 % 100 mL IVPB  Status:  Discontinued       Note to Pharmacy: Pharmacy may adjust dose strength for optimal dosing.   Send with patient on call to the OR.  Anesthesia to complete antibiotic administration <11min prior to incision per [facility].   2 g 200 mL/hr over 30 Minutes Intravenous On call to O.R. 11/29/20 0740 11/29/20 0745       Assessment/Plan BRBPR Distal colonic mass  S/p laparoscopic assisted sigmoid colectomy 12/01/2019 Dr. Hassell Done - await surgical path. - s/p colonoscopy 11/26/20 by Dr. Loletha Carrow, malignant appearing mass, partially obstructing, no Bx obtained - CEA 0.9 - Allow FLD, saline lock IV - surgical path pending - likely could go home today on FLD vs SOFT diet. Will confirm with attending MD. Will need outpatient follow up with Dr. Hassell Done and well as with oncology.      LOS: 5 days    Obie Dredge, [facility] Please see Amion for pager number during day hours 7:00am-4:30pm

## 2020-12-02 NOTE — Discharge Instructions (Signed)
[facility]      [facility] [phone]  OPEN ABDOMINAL SURGERY: POST OP INSTRUCTIONS  Always review your discharge instruction sheet given to you by the facility where your surgery was performed.  IF YOU HAVE DISABILITY OR FAMILY LEAVE FORMS, YOU MUST BRING THEM TO THE OFFICE FOR PROCESSING.  PLEASE DO NOT GIVE THEM TO YOUR DOCTOR.  1. A prescription for pain medication may be given to you upon discharge.  Take your pain medication as prescribed, if needed.  If narcotic pain medicine is not needed, then you may take acetaminophen (Tylenol) or ibuprofen (Advil) as needed. 2. Take your usually prescribed medications unless otherwise directed. 3. If you need a refill on your pain medication, please contact your pharmacy. They will contact our office to request authorization.  Prescriptions will not be filled after 5pm or on week-ends. 4. You should follow a light diet the first few days after arrival home, such as soup and crackers, pudding, etc.unless your doctor has advised otherwise. A high-fiber, low fat diet can be resumed as tolerated.   Be sure to include lots of fluids daily. Most patients will experience some swelling and bruising on the chest and neck area.  Ice packs will help.  Swelling and bruising can take several days to resolve 5. Most patients will experience some swelling and bruising in the area of the incision. Ice pack will help. Swelling and bruising can take several days to resolve..  6. It is common to experience some constipation if taking pain medication after surgery.  Increasing fluid intake and taking a stool softener will usually help or prevent this problem from occurring.  A mild laxative (Milk of Magnesia or Miralax) should be taken according to package directions if there are no bowel movements after 48 hours. 7.  You may have steri-strips (small skin tapes) in place directly over the incision.  These strips should be left on the skin for 7-10 days.  If your  surgeon used skin glue on the incision, you may shower in 24 hours.  The glue will flake off over the next 2-3 weeks.  Any sutures or staples will be removed at the office during your follow-up visit. You may find that a light gauze bandage over your incision may keep your staples from being rubbed or pulled. You may shower and replace the bandage daily. 8. ACTIVITIES:  You may resume regular (light) daily activities beginning the next day--such as daily self-care, walking, climbing stairs--gradually increasing activities as tolerated.  You may have sexual intercourse when it is comfortable.  Refrain from any heavy lifting or straining until approved by your doctor. a. You may drive when you no longer are taking prescription pain medication, you can comfortably wear a seatbelt, and you can safely maneuver your car and apply brakes b. Return to Work: ___________________________________ 9. You should see your doctor in the office for a follow-up appointment approximately two weeks after your surgery.  Make sure that you call for this appointment within a day or two after you arrive home to insure a convenient appointment time. OTHER INSTRUCTIONS:  _____________________________________________________________ _____________________________________________________________  WHEN TO CALL YOUR DOCTOR: 1. Fever over 101.0 2. Inability to urinate 3. Nausea and/or vomiting 4. Extreme swelling or bruising 5. Continued bleeding from incision. 6. Increased pain, redness, or drainage from the incision. 7. Difficulty swallowing or breathing 8. Muscle cramping or spasms. 9. Numbness or tingling in hands or feet or around lips.  The clinic staff is available to   answer your questions during regular business hours.  Please don't hesitate to call and ask to speak to one of the nurses if you have concerns.  For further questions, please visit [email or web address]   Full Liquid Diet (FOLLOW FOR AN  ADDITIONAL 3 DAYS AFTER DISCHARGE) A full liquid diet refers to fluids and foods that are liquid, or will become liquid, at room temperature. This diet should only be used for a short period of time to help you recover from illness or surgery. Your health care provider or dietitian will help determine when it is safe to eat regular foods again. What are tips for following this plan? Reading food labels  Check food labels of nutrition shakes for the amount of protein. Look for nutrition shakes that have at least 8-10 grams of protein in each serving.  Choose drinks, such as milks and juices, that are "fortified" or "enriched." This means that vitamins and minerals have been added. Shopping  Buy pre-made nutrition shakes to keep on hand.  To vary your choices, buy different flavors of milks and shakes. Meal planning  Choose flavors and foods that you enjoy.  To make sure you get enough energy and calories from food: ? Have three full liquid meals each day. Have a liquid snack between each meal. ? Drink 6-8 oz (177-237 mL) of a nutritional supplement shake with meals or as snacks. ? Add protein powder, powdered milk, milk, or yogurt to shakes to increase the amount of protein.  Drink at least one serving a day of citrus fruit juice or fruit juice that has vitamin C added. General guidelines  Before starting the full liquid diet, check with your health care provider to know what foods you should avoid. These may include full-fat or high-fiber liquids.  You may have any liquid or food that becomes a liquid at room temperature. The food is considered a liquid if it can be poured off a spoon at room temperature.  Do not drink alcohol unless approved by your health care provider.  This diet gives you most of the nutrients that you need for energy, but you may not get enough of certain vitamins, minerals, and fiber. Make sure to talk to your health care provider or dietitian about: ? How many  calories you need to eat each day. ? How much fluid you should have each day. ? Taking a multivitamin or a nutritional supplement. What foods should I eat? Fruits Fruit juice without pulp. Strained fruit pures (seeds and skins removed). Vegetables Pulp-free tomato or vegetable juice. Vegetables pured in soup. Grains Thin, hot cereal, such as farina. Soft-cooked pasta or rice pured in soup. Meats and other proteins Beef, chicken, and fish broths. Powdered protein supplements. Dairy Milk and milk-based beverages, including milk shakes and instant breakfast mixes. Smooth yogurt. Pured cottage cheese. Fats and oils Melted margarine and butter. Cream. Canola, almond, avocado, corn, grapeseed, sunflower, and sesame oils. Gravy. Beverages Water. Coffee and tea (caffeinated or decaffeinated). Cocoa. Liquid nutritional supplements. Soft drinks. Nondairy milks, such as almond, coconut, rice, or soy milk. Sweets and desserts Custard. Pudding. Flavored gelatin. Smooth ice cream (without nuts or candy pieces). Sherbet. Frozen ice pops. [place] ice. Pudding pops. Seasonings and condiments Salt and pepper. Spices. Vinegar. Ketchup. Yellow mustard. Smooth sauces, such as Hollanda[place], cheese sauce, or white sauce. Soy sauce. Syrup. Honey. Jelly (without fruit pieces). Other foods Cocoa powder. Cream soups. Strained soups. The items listed above may not be a complete list of foods and beverages  you can eat. Contact a dietitian for more information.   What foods should I avoid? Fruits All whole fresh, frozen, or canned fruits. Vegetables All whole fresh, frozen, or canned vegetables. Grains Whole grains. Pasta. Rice. Cold cereal. Bread. Crackers. Meats and other proteins All cuts of meat, poultry, and fish. Eggs. Tofu and soy protein. Nuts and nut butters. Precooked or cured meat, such as sausages or meat loaves. Dairy Hard cheese. Yogurt with fruit chunks. Fats and oils Coconut oil. Palm oil.  Lard. Cold butter. Sweets and desserts Ice cream or other frozen desserts that contain solids, such as nuts, chocolate chips, and pieces of cookies. Cakes. Cookies. Candy. Seasonings and condiments Stone-ground mustard. Other foods Soups with chunks or pieces. The items listed above may not be a complete list of foods and beverages you should avoid. Contact a dietitian for more information. Summary  A full liquid diet refers to fluids and foods that are liquid or will become liquid at room temperature.  This diet should only be used for a short period of time to help you recover from illness or surgery. Ask your health care provider or dietitian when it is safe for you to eat regular foods.  To make sure you get enough calories and nutrients, eat three meals each day with snacks in between. Drink pre-made nutritional supplement shakes or add protein powder to homemade shakes. Talk to your health care provider about taking a vitamin and mineral supplement. This information is not intended to replace advice given to you by your health care provider. Make sure you discuss any questions you have with your health care provider. Document Revised: 06/28/2020 Document Reviewed: 06/28/2020 Elsevier Patient Education  2021 [place] (EAT A DIET LOWER IN FIBER FOR ONE WEEK, THEN ADD BACK IN FIBROUS FOODS) Fiber is found in fruits, vegetables, whole grains, and beans. Eating a diet low in fiber helps to reduce how often you have bowel movements and the amount of stool you produce. A low-fiber eating plan may help your digestive system heal if you:  Have certain conditions, such as Crohn's disease, diverticulitis, or irritable bowel syndrome (IBS), and are having a flare-up.  Recently had radiation therapy on your pelvis or bowel.  Recently had intestinal surgery.  Have a new surgical opening in your abdomen (colostomy or ileostomy).  Have an intestine that has narrowed  (stricture). Your health care provider will tell you how long to stay on this diet and may recommend that you work with a dietitian. What are tips for following this plan? Reading food labels  Check the nutrition facts label on food products for the amount of dietary fiber.  Choose foods that have less than 2 grams (g) of fiber per serving.   Cooking  Use white flour for baking and cooking.  Cook meat using methods that keep it tender, such as braising or poaching.  Cook eggs until the yolk is completely solid.  Cook with healthy oils, such as olive oil or canola oil. Meal planning  Eat 5-6 small meals throughout the day instead of 3 large meals.  If you are lactose intolerant: ? Choose low-lactose dairy foods. ? Do not eat dairy foods if told by your health care provider or dietitian.  Limit fats and oils to less than 8 teaspoons (39 mL) a day.  Eat small portions of desserts.  Limit acidic, spicy, or fried foods to reduce gas, bloating, and discomfort. General information  Follow instructions from your  health care provider or dietitian about how much fiber you should have each day.  Most people on a low-fiber eating plan should eat less than 10 g of fiber a day. Your daily fiber goal is _________________ g.  Take vitamin and mineral supplements as told by your health care provider or dietitian. Chewable or liquid forms are best when on this eating plan. A gummy vitamin is not recommended. What foods should I eat? Fruits Soft-cooked or canned fruits without skin and seeds. Ripe banana. Applesauce. Fruit juice without pulp. Vegetables Well-cooked or canned vegetables without skin, seeds, or stems. Cooked potatoes without skins. Vegetable juice. Grains All bread and crackers made with white flour. Waffles, pancakes, and [place] toast. Bagels. Pretzels. Melba toast, zwieback, and matzoh. Cooked and dried cereals that do not have whole grains, added fiber, seeds, or dried  fruit. Domenick Gong. Hot and cold cereals made with refined corn, rice, or oats. Plain pasta and noodles. White rice. Meats and other proteins Ground meat. Tender cuts of meat or poultry. Eggs. Fish, seafood, and shellfish. Smooth nut butters. Tofu. Dairy All milk products and drinks. Lactose-free milk, including rice, soy, and almond milk. Yogurt without fruit, nuts, chocolate, or granola mixed in. Sour cream. Cottage cheese. Cheese. Fats and oils Olive oil, canola oil, sunflower oil, flaxseed oil, avocado oil, and grapeseed oil. Mayonnaise. Cream cheese. Margarine. Butter. Beverages Decaf coffee. Fruit and vegetable juices. Smoothies (in small amounts, with no pulp or skins, and with fruits from the recommended list). Sports drinks. Herbal tea. Water. Sweets and desserts Plain cakes. Cookies. Cream pies and pies made with recommended fruits. Pudding. Custard. Fruit gelatin. Sherbet. Ice pops. Ice cream without nuts. Hard candy. Honey. Jelly. Molasses. Syrups. Chocolate. Marshmallows. Gumdrops. Seasonings and condiments Ketchup. Mild mustard. Mild salad dressings. Plain gravies. Vinegar. Spices in moderation. Salt. Sugar. Other foods Bouillon. Broth. Cream and strained soups made from recommended foods. Casseroles made with recommended foods. The items listed above may not be a complete list of foods and beverages you can eat. Contact a dietitian for more information. What foods should I avoid? Fruits Raw or dried fruit. Berries. Fruit juice with pulp. Prune juice. Vegetables Potato skins. Raw or undercooked vegetables. All beans and bean sprouts. Cooked greens. Corn. Peas. Cabbage. Beets. Broccoli. Brussels sprouts. Cauliflower. Mushrooms. Onions. Peppers. Parsnips. Okra. Sauerkraut. Grains Whole-wheat, whole-grain, or multigrain breads, cereals, or crackers. Rye bread. Cereals with nuts, raisins, or coconut. Bran. Granola. High-fiber cereals. Cornmeal or corn bread. Whole-grain pasta. Wild or  brown rice. Quinoa. Popcorn. Buckwheat. Wheat germ. Meats and other proteins Tough, fibrous meats with gristle. Fatty meat. Poultry with skin. Fried meat, Sales executive, or fish. Precooked or cured meat, such as sausages or meat loaves. Berniece Salines. Hot dogs. Nuts and chunky nut butter. Dried peas, beans, and lentils. Hummus. Dairy Yogurt with fruit, nuts, chocolate, or granola mixed in. Full-fat dairy such as whole milk, ice cream, or sour cream. Beverages Caffeinated coffee and teas. Fats and oils Avocado. Coconut. Butter. Sweets and desserts Desserts, cookies, or candies that contain nuts or coconut. Dried fruit. Jams and preserves with seeds. Marmalade. Any dessert made with fruits or grains that are not recommended. Seasonings and condiments Relish. Horseradish. Angie Fava. Olives. Other foods Corn tortilla chips. Soups made with vegetables or grains that are not recommended. The items listed above may not be a complete list of foods and beverages you should avoid. Contact a dietitian for more information. Summary  Most people on a low-fiber eating plan should eat less than 10  grams of fiber a day. Follow recommendations from your health care provider or dietitian about how much fiber you should have each day.  Always check nutrition facts labels to see the dietary fiber amount in packaged foods. A low-fiber food will have less than 2 grams of fiber per serving.  Try to avoid whole grains, raw fruits and vegetables, dried fruit, tough cuts of meat, nuts, and seeds.  Take a vitamin and mineral supplement as told by your health care provider or dietitian. This information is not intended to replace advice given to you by your health care provider. Make sure you discuss any questions you have with your health care provider. Document Revised: 01/14/2020 Document Reviewed: 01/14/2020 Elsevier Patient Education  2021 [place].    High-Fiber Eating Plan Fiber, also called dietary fiber, is a type  of carbohydrate. It is found foods such as fruits, vegetables, whole grains, and beans. A high-fiber diet can have many health benefits. Your health care provider may recommend a high-fiber diet to help:  Prevent constipation. Fiber can make your bowel movements more regular.  Lower your cholesterol.  Relieve the following conditions: ? Inflammation of veins in the anus (hemorrhoids). ? Inflammation of specific areas of the digestive tract (uncomplicated diverticulosis). ? A problem of the large intestine, also called the colon, that sometimes causes pain and diarrhea (irritable bowel syndrome, or IBS).  Prevent overeating as part of a weight-loss plan.  Prevent heart disease, type 2 diabetes, and certain cancers. What are tips for following this plan? Reading food labels  Check the nutrition facts label on food products for the amount of dietary fiber. Choose foods that have 5 grams of fiber or more per serving.  The goals for recommended daily fiber intake include: ? Men (age 30 or younger): 34-38 g. ? Men (over age 1): 28-34 g. ? Women (age 64 or younger): 25-28 g. ? Women (over age 42): 22-25 g. Your daily fiber goal is _____________ g.   Shopping  Choose whole fruits and vegetables instead of processed forms, such as apple juice or applesauce.  Choose a wide variety of high-fiber foods such as avocados, lentils, oats, and kidney beans.  Read the nutrition facts label of the foods you choose. Be aware of foods with added fiber. These foods often have high sugar and sodium amounts per serving. Cooking  Use whole-grain flour for baking and cooking.  Cook with brown rice instead of white rice. Meal planning  Start the day with a breakfast that is high in fiber, such as a cereal that contains 5 g of fiber or more per serving.  Eat breads and cereals that are made with whole-grain flour instead of refined flour or white flour.  Eat brown rice, bulgur wheat, or millet  instead of white rice.  Use beans in place of meat in soups, salads, and pasta dishes.  Be sure that half of the grains you eat each day are whole grains. General information  You can get the recommended daily intake of dietary fiber by: ? Eating a variety of fruits, vegetables, grains, nuts, and beans. ? Taking a fiber supplement if you are not able to take in enough fiber in your diet. It is better to get fiber through food than from a supplement.  Gradually increase how much fiber you consume. If you increase your intake of dietary fiber too quickly, you may have bloating, cramping, or gas.  Drink plenty of water to help you digest fiber.  Choose  high-fiber snacks, such as berries, raw vegetables, nuts, and popcorn. What foods should I eat? Fruits Berries. Pears. Apples. Oranges. Avocado. Prunes and raisins. Dried figs. Vegetables Sweet potatoes. Spinach. Kale. Artichokes. Cabbage. Broccoli. Cauliflower. Green peas. Carrots. Squash. Grains Whole-grain breads. Multigrain cereal. Oats and oatmeal. Brown rice. Barley. Bulgur wheat. Rheems. Quinoa. Bran muffins. Popcorn. Rye wafer crackers. Meats and other proteins Navy beans, kidney beans, and pinto beans. Soybeans. Split peas. Lentils. Nuts and seeds. Dairy Fiber-fortified yogurt. Beverages Fiber-fortified soy milk. Fiber-fortified orange juice. Other foods Fiber bars. The items listed above may not be a complete list of recommended foods and beverages. Contact a dietitian for more information. What foods should I avoid? Fruits Fruit juice. Cooked, strained fruit. Vegetables Fried potatoes. Canned vegetables. Well-cooked vegetables. Grains White bread. Pasta made with refined flour. White rice. Meats and other proteins Fatty cuts of meat. Fried chicken or fried fish. Dairy Milk. Yogurt. Cream cheese. Sour cream. Fats and oils Butters. Beverages Soft drinks. Other foods Cakes and pastries. The items listed above may  not be a complete list of foods and beverages to avoid. Talk with your dietitian about what choices are best for you. Summary  Fiber is a type of carbohydrate. It is found in foods such as fruits, vegetables, whole grains, and beans.  A high-fiber diet has many benefits. It can help to prevent constipation, lower blood cholesterol, aid weight loss, and reduce your risk of heart disease, diabetes, and certain cancers.  Increase your intake of fiber gradually. Increasing fiber too quickly may cause cramping, bloating, and gas. Drink plenty of water while you increase the amount of fiber you consume.  The best sources of fiber include whole fruits and vegetables, whole grains, nuts, seeds, and beans. This information is not intended to replace advice given to you by your health care provider. Make sure you discuss any questions you have with your health care provider. Document Revised: 01/14/2020 Document Reviewed: 01/14/2020 Elsevier Patient Education  2021 [employer].

## 2020-12-02 NOTE — Discharge Summary (Signed)
Physician Discharge Summary  Rafal Archuleta ZOX:[id number] DOB: [birth year] DOA: 11/25/2020  PCP: Janith Lima, MD  Admit date: 11/25/2020 Discharge date: 12/02/2020  Admitted From: home Disposition:  home  Recommendations for Outpatient Follow-up:  1. Follow up with PCP in 1-2 weeks 2. Follow-up with oncology 3. Please follow up on the following pending results:  Home Health:no  Equipment/Devices: none  Discharge Condition: Stable Code Status:   Code Status: Full Code Diet recommendation:  Diet Order            Diet full liquid Room service appropriate? Yes; Fluid consistency: Thin  Diet effective now                 Brief/Interim Summary:  35 year old male with no significant medical history presented with GI bleeding bright red blood per rectum and found to have malignant mass during colonoscopy, surgery was consulted S/P resection 3/9 Patient doing well postoperatively diet was slowly advanced. Patient will need to stay on full liquid diet for 3 days.  He has been cleared for discharge by surgery.  Has a department but nonbloody.  Pain is controlled.  Ambulatory referral has been made to oncology clinic for follow-up.  Close follow-up with surgery for biopsy results  Discharge Diagnoses:   BRBPR/Lower GI bleed/Colonic mass: Concerning for GI malignancy with associated bleeding.  CT abdomen and CT angio abdomen:negative and is status post colonoscopy by GI 3/5-no biopsy obtained and [facility] consulted-s/p laparoscopic-assisted sigmoid colectomy 3/9 by Dr. Everlene Balls pending.  CEA 0.9.   Being discharged home after cleared by general surgery and will need close follow-up for further biopsy result and further work-up possible hematology oncology referral and will defer to surgical service and outpatient follow-up   Acute blood loss anemia due to #1 hemoglobin remains stable.Iron panel normal.  Monitor CBC outpatient.  Colonoscopy result Preparation of the colon was poor. Residual                             opaque liquid and some food debris debris - could                            not be completely cleared, but there is no large                            polyp or additional mass elsewhere in the colon.                           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 Not biopsied - it                            is malignant by appearance and the source of                            bleeding.                           - The examination was otherwise normal on direct  and retroflexion views.                           - No specimens collected. Recommendation:Surgical consultation for inpatient resection.                            More bowel prep would be ideal before surgery as                            long as patient remains stable.  Consults:  General surgery, gastroenterology  Subjective: Alert awake oriented, having bad night but nonbloody.  Wife at the bedside.  Discharge Exam: Vitals:   12/02/20 0618 12/02/20 1243  BP: 91/64 106/60  Pulse: 77 77  Resp: 14 16  Temp: 98.7 F (37.1 C) 98.3 F (36.8 C)  SpO2: 97% 97%   General: Pt is alert, awake, not in acute distress Cardiovascular: RRR, S1/S2 +, no rubs, no gallops Respiratory: CTA bilaterally, no wheezing, no rhonchi Abdominal: Soft, NT, ND, bowel sounds + Extremities: no edema, no cyanosis  Discharge Instructions  Discharge Instructions    Discharge instructions   Complete by: As directed    Please call call MD or return to ER for similar or worsening recurring problem that brought you to hospital or if any fever,nausea/vomiting,abdominal pain, uncontrolled pain, chest pain,  shortness of breath or any other alarming symptoms.  Continue on full liquid diet for 3 days  Please follow-up.  Oncology in 1 to 2 weeks.  Please follow-up your doctor as instructed in a week time and call the office for  appointment.  Please avoid alcohol, smoking, or any other illicit substance and maintain healthy habits including taking your regular medications as prescribed.  You were cared for by a hospitalist during your hospital stay. If you have any questions about your discharge medications or the care you received while you were in the hospital after you are discharged, you can call the unit and ask to speak with the hospitalist on call if the hospitalist that took care of you is not available.  Once you are discharged, your primary care physician will handle any further medical issues. Please note that NO REFILLS for any discharge medications will be authorized once you are discharged, as it is imperative that you return to your primary care physician (or establish a relationship with a primary care physician if you do not have one) for your aftercare needs so that they can reassess your need for medications and monitor your lab values   Discharge wound care:   Complete by: As directed    Keep the surgical site clean dry and intact   Increase activity slowly   Complete by: As directed      Allergies as of 12/02/2020   No Known Allergies     Medication List    TAKE these medications   acetaminophen 325 MG tablet Commonly known as: TYLENOL Take 2 tablets (650 mg total) by mouth every 6 (six) hours as needed for mild pain (or Fever >/= 101).   Cholecalciferol 1.25 MG (50000 UT) capsule Take 1 capsule (50,000 Units total) by mouth once a week.   feeding supplement Liqd Take 237 mLs by mouth 2 (two) times daily between meals.   Melatonin 5 MG Chew Chew 10 mg by mouth at bedtime.   oxyCODONE 5 MG immediate release tablet Commonly  known as: Oxy IR/ROXICODONE Take 1 tablet (5 mg total) by mouth every 6 (six) hours as needed for moderate pain, severe pain or breakthrough pain.            Discharge Care Instructions  (From admission, onward)         Start     Ordered   12/02/20 0000   Discharge wound care:       Comments: Keep the surgical site clean dry and intact   12/02/20 1307          Follow-up Information    Johnathan Hausen, MD. Go on 12/22/2020.   Specialty: General Surgery Why: at 11:30 AM for follow up from recent surgery. please arrive by 11:15 AM.  Contact information: [address] [place] [address] [phone]        Surgery, [place]. Go on 12/14/2020.   Specialty: General Surgery Why: at 2:00 PM for staple removal by a nurse. please arrive 30 minutes early to get checked in and fill out any necessary paperwork. Contact information: [place] [address] [phone]              No Known Allergies  The results of significant diagnostics from this hospitalization (including imaging, microbiology, ancillary and laboratory) are listed below for reference.    Microbiology: Recent Results (from the past 240 hour(s))  Resp Panel by RT-PCR (Flu A&B, Covid) Nasopharyngeal Swab     Status: None   Collection Time: 11/25/20  2:10 AM   Specimen: Nasopharyngeal Swab; Nasopharyngeal(NP) swabs in vial transport medium  Result Value Ref Range Status   SARS Coronavirus 2 by RT PCR NEGATIVE NEGATIVE Final    Comment: (NOTE) SARS-CoV-2 target nucleic acids are NOT DETECTED.  The SARS-CoV-2 RNA is generally detectable in upper respiratory specimens during the acute phase of infection. The lowest concentration of SARS-CoV-2 viral copies this assay can detect is 138 copies/mL. A negative result does not preclude SARS-Cov-2 infection and should not be used as the sole basis for treatment or other patient management decisions. A negative result may occur with  improper specimen collection/handling, submission of specimen other than nasopharyngeal swab, presence of viral mutation(s) within the areas targeted by this assay, and inadequate number of viral copies(<138 copies/mL). A negative result must be  combined with clinical observations, patient history, and epidemiological information. The expected result is Negative.  Fact Sheet for Patients:  EntrepreneurPulse.com.au  Fact Sheet for Healthcare Providers:  IncredibleEmployment.be  This test is no t yet approved or cleared by the [place] FDA and  has been authorized for detection and/or diagnosis of SARS-CoV-2 by FDA under an Emergency Use Authorization (EUA). This EUA will remain  in effect (meaning this test can be used) for the duration of the COVID-19 declaration under Section 564(b)(1) of the Act, 21 U.S.C.section 360bbb-3(b)(1), unless the authorization is terminated  or revoked sooner.       Influenza A by PCR NEGATIVE NEGATIVE Final   Influenza B by PCR NEGATIVE NEGATIVE Final    Comment: (NOTE) The Xpert Xpress SARS-CoV-2/FLU/RSV plus assay is intended as an aid in the diagnosis of influenza from Nasopharyngeal swab specimens and should not be used as a sole basis for treatment. Nasal washings and aspirates are unacceptable for Xpert Xpress SARS-CoV-2/FLU/RSV testing.  Fact Sheet for Patients: EntrepreneurPulse.com.au  Fact Sheet for Healthcare Providers: IncredibleEmployment.be  This test is not yet approved or cleared by the [place] FDA and has  been authorized for detection and/or diagnosis of SARS-CoV-2 by FDA under an Emergency Use Authorization (EUA). This EUA will remain in effect (meaning this test can be used) for the duration of the COVID-19 declaration under Section 564(b)(1) of the Act, 21 U.S.C. section 360bbb-3(b)(1), unless the authorization is terminated or revoked.  Performed at [facility], [place] [address], [place], [place] [address]     Procedures/Studies: CT ABDOMEN PELVIS W CONTRAST  Result Date: 11/24/2020 CLINICAL DATA:  Gastrointestinal bleeding with bright red blood in his stool  this afternoon. EXAM: CT ABDOMEN AND PELVIS WITH CONTRAST TECHNIQUE: Multidetector CT imaging of the abdomen and pelvis was performed using the standard protocol following bolus administration of intravenous contrast. CONTRAST:  174mL OMNIPAQUE IOHEXOL 300 MG/ML  SOLN COMPARISON:  None. FINDINGS: Lower chest: Unremarkable. Hepatobiliary: No focal liver abnormality is seen. No gallstones, gallbladder wall thickening, or biliary dilatation. Pancreas: Unremarkable. No pancreatic ductal dilatation or surrounding inflammatory changes. Spleen: Normal in size without focal abnormality. Adrenals/Urinary Tract: Adrenal glands are unremarkable. Kidneys are normal, without renal calculi, focal lesion, or hydronephrosis. Bladder is unremarkable. Stomach/Bowel: Stomach is within normal limits. Appendix appears normal. No evidence of bowel wall thickening, distention, or inflammatory changes. Vascular/Lymphatic: No significant vascular findings are present. No enlarged abdominal or pelvic lymph nodes. Reproductive: Prostate is unremarkable. Other: No abdominal wall hernia or abnormality. No abdominopelvic ascites. Musculoskeletal: Normal appearing bones. IMPRESSION: Normal examination. Electronically Signed   By: Claudie Revering M.D.   On: 11/24/2020 16:24   CT Angio Abd/Pel w/ and/or w/o  Result Date: 11/25/2020 CLINICAL DATA:  Recent GI bleeding, history of hemorrhoids EXAM: CTA ABDOMEN AND PELVIS WITHOUT AND WITH CONTRAST TECHNIQUE: Multidetector CT imaging of the abdomen and pelvis was performed using the standard protocol during bolus administration of intravenous contrast. Multiplanar reconstructed images and MIPs were obtained and reviewed to evaluate the vascular anatomy. CONTRAST:  134mL OMNIPAQUE IOHEXOL 350 MG/ML SOLN COMPARISON:  CT from the previous day. FINDINGS: VASCULAR Aorta: Normal caliber aorta without aneurysm, dissection, vasculitis or significant stenosis. Celiac: Patent without evidence of aneurysm,  dissection, vasculitis or significant stenosis. SMA: Patent without evidence of aneurysm, dissection, vasculitis or significant stenosis. Renals: Both renal arteries are patent without evidence of aneurysm, dissection, vasculitis, fibromuscular dysplasia or significant stenosis. IMA: Patent without evidence of aneurysm, dissection, vasculitis or significant stenosis. Iliacs: Patent without evidence of aneurysm, dissection, vasculitis or significant stenosis. Veins: No specific venous abnormality is noted. Review of the MIP images confirms the above findings. NON-VASCULAR Lower chest: No acute abnormality. Hepatobiliary: No focal liver abnormality is seen. No gallstones, gallbladder wall thickening, or biliary dilatation. Pancreas: Unremarkable. No pancreatic ductal dilatation or surrounding inflammatory changes. Spleen: Normal in size without focal abnormality. Adrenals/Urinary Tract: Adrenal glands are unremarkable. Kidneys are normal, without renal calculi, focal lesion, or hydronephrosis. Bladder is unremarkable. Stomach/Bowel: No obstructive or inflammatory changes are noted within the colon. No areas of contrast extravasation are identified to suggest acute hemorrhage. Small bowel and stomach are within normal limits. Lymphatic: No enlarged abdominal or pelvic lymph nodes. Reproductive: Prostate is unremarkable. Other: No abdominal wall hernia or abnormality. No abdominopelvic ascites. Musculoskeletal: No acute or significant osseous findings. IMPRESSION: VASCULAR No acute vascular abnormality is noted. No findings to suggest active GI hemorrhage are seen. NON-VASCULAR No acute abnormality noted.  Unchanged from the previous day. Electronically Signed   By: Inez Catalina M.D.   On: 11/25/2020 05:10    Labs: BNP (last 3 results) No results for input(s): BNP  in the last 8760 hours. Basic Metabolic Panel: Recent Labs  Lab 11/26/20 0504 11/30/20 0535 12/01/20 0547  NA 138 137 138  K 3.9 3.5 3.8  CL 102  103 104  CO2 28 27 26   GLUCOSE 90 206* 135*  BUN 8 7 6   CREATININE 0.85 0.84 0.92  CALCIUM 9.0 9.0 8.8*   Liver Function Tests: Recent Labs  Lab 12/01/20 0547  AST 16  ALT 12  ALKPHOS 36*  BILITOT 1.0  PROT 5.7*  ALBUMIN 3.5   No results for input(s): LIPASE, AMYLASE in the last 168 hours. No results for input(s): AMMONIA in the last 168 hours. CBC: Recent Labs  Lab 11/26/20 1706 11/27/20 0508 11/30/20 0535 12/01/20 0547 12/02/20 0548  WBC 11.4* 5.3 5.0 11.4* 8.4  HGB 11.9* 11.4* 11.7* 11.2* 10.5*  HCT 34.1* 32.4* 33.1* 31.5* 31.1*  MCV 95.0 95.6 93.5 94.9 96.6  PLT 160 142* 175 174 169   Cardiac Enzymes: No results for input(s): CKTOTAL, CKMB, CKMBINDEX, TROPONINI in the last 168 hours. BNP: Invalid input(s): POCBNP CBG: No results for input(s): GLUCAP in the last 168 hours. D-Dimer No results for input(s): DDIMER in the last 72 hours. Hgb A1c No results for input(s): HGBA1C in the last 72 hours. Lipid Profile No results for input(s): CHOL, HDL, LDLCALC, TRIG, CHOLHDL, LDLDIRECT in the last 72 hours. Thyroid function studies No results for input(s): TSH, T4TOTAL, T3FREE, THYROIDAB in the last 72 hours.  Invalid input(s): FREET3 Anemia work up No results for input(s): VITAMINB12, FOLATE, FERRITIN, TIBC, IRON, RETICCTPCT in the last 72 hours. Urinalysis    Component Value Date/Time   COLORURINE STRAW (A) 11/24/2020 1812   APPEARANCEUR CLEAR 11/24/2020 1812   LABSPEC 1.014 11/24/2020 1812   PHURINE 8.0 11/24/2020 1812   GLUCOSEU NEGATIVE 11/24/2020 1812   HGBUR NEGATIVE 11/24/2020 1812   BILIRUBINUR NEGATIVE 11/24/2020 1812   KETONESUR NEGATIVE 11/24/2020 1812   PROTEINUR NEGATIVE 11/24/2020 1812   NITRITE NEGATIVE 11/24/2020 1812   LEUKOCYTESUR NEGATIVE 11/24/2020 1812   Sepsis Labs Invalid input(s): PROCALCITONIN,  WBC,  LACTICIDVEN Microbiology Recent Results (from the past 240 hour(s))  Resp Panel by RT-PCR (Flu A&B, Covid) Nasopharyngeal Swab      Status: None   Collection Time: 11/25/20  2:10 AM   Specimen: Nasopharyngeal Swab; Nasopharyngeal(NP) swabs in vial transport medium  Result Value Ref Range Status   SARS Coronavirus 2 by RT PCR NEGATIVE NEGATIVE Final    Comment: (NOTE) SARS-CoV-2 target nucleic acids are NOT DETECTED.  The SARS-CoV-2 RNA is generally detectable in upper respiratory specimens during the acute phase of infection. The lowest concentration of SARS-CoV-2 viral copies this assay can detect is 138 copies/mL. A negative result does not preclude SARS-Cov-2 infection and should not be used as the sole basis for treatment or other patient management decisions. A negative result may occur with  improper specimen collection/handling, submission of specimen other than nasopharyngeal swab, presence of viral mutation(s) within the areas targeted by this assay, and inadequate number of viral copies(<138 copies/mL). A negative result must be combined with clinical observations, patient history, and epidemiological information. The expected result is Negative.  Fact Sheet for Patients:  EntrepreneurPulse.com.au  Fact Sheet for Healthcare Providers:  IncredibleEmployment.be  This test is no t yet approved or cleared by the [place] FDA and  has been authorized for detection and/or diagnosis of SARS-CoV-2 by FDA under an Emergency Use Authorization (EUA). This EUA will remain  in effect (meaning this test can be used)  for the duration of the COVID-19 declaration under Section 564(b)(1) of the Act, 21 U.S.C.section 360bbb-3(b)(1), unless the authorization is terminated  or revoked sooner.       Influenza A by PCR NEGATIVE NEGATIVE Final   Influenza B by PCR NEGATIVE NEGATIVE Final    Comment: (NOTE) The Xpert Xpress SARS-CoV-2/FLU/RSV plus assay is intended as an aid in the diagnosis of influenza from Nasopharyngeal swab specimens and should not be used as a sole basis  for treatment. Nasal washings and aspirates are unacceptable for Xpert Xpress SARS-CoV-2/FLU/RSV testing.  Fact Sheet for Patients: EntrepreneurPulse.com.au  Fact Sheet for Healthcare Providers: IncredibleEmployment.be  This test is not yet approved or cleared by the [place] FDA and has been authorized for detection and/or diagnosis of SARS-CoV-2 by FDA under an Emergency Use Authorization (EUA). This EUA will remain in effect (meaning this test can be used) for the duration of the COVID-19 declaration under Section 564(b)(1) of the Act, 21 U.S.C. section 360bbb-3(b)(1), unless the authorization is terminated or revoked.  Performed at [facility], [place] [address] [place], [place] [address]      Time coordinating discharge: 25 minutes  SIGNED: Antonieta Pert, MD  [facility] 12/02/2020, 1:19 PM  If 7PM-7AM, please contact night-coverage [email or web address]

## 2020-12-05 DIAGNOSIS — F4323 Adjustment disorder with mixed anxiety and depressed mood: Secondary | ICD-10-CM | POA: Diagnosis not present

## 2020-12-05 NOTE — Progress Notes (Signed)
Spoke with patient regarding referral we received from the hospital s/p surgery for colon cancer.  I offered the patient to wait a few weeks prior to coming in to see medical oncology however he prefers to come in next week.  He is scheduled for Monday 12/12/2020 at 11:00 to see [name] & Dr. Truitt Merle.  He is aware of our location and that he can bring one person with him to the consult.  I also mentioned Genetic Testing to him as well.

## 2020-12-06 LAB — SURGICAL PATHOLOGY

## 2020-12-12 ENCOUNTER — Encounter: Payer: Self-pay | Admitting: Nurse Practitioner

## 2020-12-12 ENCOUNTER — Other Ambulatory Visit: Payer: Self-pay

## 2020-12-12 ENCOUNTER — Inpatient Hospital Stay: Payer: Federal, State, Local not specified - PPO | Attending: Nurse Practitioner | Admitting: Nurse Practitioner

## 2020-12-12 VITALS — BP 111/61 | HR 72 | Temp 98.2°F | Resp 16 | Ht 69.0 in | Wt 163.0 lb

## 2020-12-12 DIAGNOSIS — C187 Malignant neoplasm of sigmoid colon: Secondary | ICD-10-CM | POA: Diagnosis not present

## 2020-12-12 DIAGNOSIS — C189 Malignant neoplasm of colon, unspecified: Secondary | ICD-10-CM

## 2020-12-12 DIAGNOSIS — F4323 Adjustment disorder with mixed anxiety and depressed mood: Secondary | ICD-10-CM | POA: Diagnosis not present

## 2020-12-12 NOTE — Progress Notes (Cosign Needed)
[place]  Telephone:([phone]) [phone] Fax:([phone]) [place] Note   Patient Care Team: Janith Lima, MD as PCP - General (Internal Medicine) Jonnie Finner, RN as Oncology Nurse Navigator Truitt Merle, MD as Consulting Physician (Oncology) Alla Feeling, NP as Nurse Practitioner (Nurse Practitioner) 12/12/2020  CHIEF COMPLAINTS/PURPOSE OF CONSULTATION:  Colon cancer, referred by general surgery Dr. Hassell Done  Summary of oncology history Oncology History  Malignant tumor of colon ([place])  11/24/2020 Miscellaneous   FOBT+ in ED - admitted for further work up    11/24/2020 Imaging   CT AP with contrast IMPRESSION: Normal examination.   11/25/2020 Imaging   CTA abdomen/pelvis IMPRESSION: VASCULAR No acute vascular abnormality is noted. No findings to suggest active GI hemorrhage are seen. NON-VASCULAR No acute abnormality noted.  Unchanged from the previous day.   11/26/2020 Procedure   Colonoscopy, Dr. Wilfrid Lund  A fungating and infiltrative partially obstructing mass was found in the distal sigmoid colon, with the distal aspect about 18-20cm from the anal verge when insufflated. The mass was partially circumferential (involving one-half of the lumen circumference), and the adult colonoscope passed easily. The mass measured about four cm in length. Oozing was present due to friable tissue with scope passage. There was also high grade stiagmata of a visible vessel within the mass that was the source of recent brisk bleeding.   11/26/2020 Tumor Marker   Baseline CEA: 0.9   11/30/2020 Definitive Surgery   Lap assisted sigmoid colectomy by Dr. Johnathan Hausen   11/30/2020 Pathology Results   FINAL MICROSCOPIC DIAGNOSIS:  A. COLON, SIGMOID, RESECTION:  -  Adenocarcinoma, moderately differentiated, 3 cm  -  No carcinoma identified in twenty-seven lymph nodes (0/27)  -  Margins uninvolved by carcinoma  Perforation-not identified Lymphovascular invasion-not  identified Perineural invasion-not identified PT3PN0 MMR normal, MSI-stable   11/30/2020 Initial Diagnosis   Malignant tumor of colon ([place])   11/30/2020 Cancer Staging   Staging form: Colon and Rectum, AJCC 8th Edition - Pathologic stage from 11/30/2020: Stage IIA (pT3, pN0, cM0) - Signed by Alla Feeling, NP on 12/12/2020 Total positive nodes: 0 Histologic grading system: 4 grade system Histologic grade (G): G2 Laterality: Left Lymph-vascular invasion (LVI): LVI not present (absent)/not identified Perineural invasion (PNI): Absent Microsatellite instability (MSI): Stable     HISTORY OF PRESENTING ILLNESS:  Henry Wall 35 y.o. male with no significant past medical history except hemorrhoids is here because of newly diagnosed colon cancer.  He had a significant episode of bright red blood per rectum with BM on 11/24/2020 and presented to the ED for evaluation.  He denies fatigue, unintentional weight loss, abdominal pain leading up to the rectal bleeding episode but does note 1 week history of urgent BM every morning.  Work-up showed a positive FOBT, CBC was normal on 3/3.  He was admitted for further work-up, CTAP with contrast on 11/24/2020 was unremarkable.  CTA also unremarkable with no obvious source of bleeding on 3/4.  He underwent diagnostic colonoscopy by Dr. Loletha Carrow on 11/26/2020 showed a fungating and infiltrative partially obstructing mass in the distal sigmoid colon which was partially circumferential there was oozing due to a friable tissue with high-grade stigmata of a visible vessel within the mass that was the source of recent brisk bleeding.  Due to risk of further bleeding no biopsy was obtained.  Hg dropped to 10.4 during hospitalization, baseline CEA was normal 0.9.  He underwent lap-assisted sigmoid colectomy by Dr. Johnathan Hausen on  11/30/2020, path showed a moderately differentiated adenocarcinoma measuring 3 cm in the sigmoid colon with focal invasion beyond the confines of the  muscularis propria compatible with an early T3 lesion, 0 of 27 lymph nodes were positive, margins were uninvolved.  There was no evidence of perforation or lymphovascular or perineural invasion.  There was preserved expression of the major mismatch repair proteins, this was MSI-stable.  This was staged pT3 pN0 M0 stage IIa colon cancer.  Socially, he is married, no children.  Lives at home with his spouse and cats.  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  He had no prior screening colonoscopy.  He has no family history of colon or other cancers.  He is a former cigarette smoker socially in college, denies other drug use.  He drinks 2 liquor drinks per night.   Today, he presents with his wife.  He is recovering from surgery, 75% back to his baseline energy level.  He is having normal bowel movements, no recurrent bloody stools.  Denies pain.  Energy and appetite are normal.  MEDICAL HISTORY:  History reviewed. No pertinent past medical history.  SURGICAL HISTORY: Past Surgical History:  Procedure Laterality Date  . COLONOSCOPY WITH PROPOFOL N/A 11/26/2020   Procedure: COLONOSCOPY WITH PROPOFOL;  Surgeon: Doran Stabler, MD;  Location: [facility];  Service: Gastroenterology;  Laterality: N/A;  . COLOSTOMY N/A 11/30/2020   Procedure: sigmoidectomy;  Surgeon: Johnathan Hausen, MD;  Location: [facility];  Service: General;  Laterality: N/A;  . SUBMUCOSAL TATTOO INJECTION  11/26/2020   Procedure: SUBMUCOSAL TATTOO INJECTION;  Surgeon: Doran Stabler, MD;  Location: [facility];  Service: Gastroenterology;;  . Arnetha Courser TOOTH EXTRACTION      SOCIAL HISTORY: Social History   Socioeconomic History  . Marital status: Married    Spouse name: Not on file  . Number of children: Not on file  . Years of education: Not on file  . Highest education level: Not on file  Occupational History  . Not on file  Tobacco Use  . Smoking status: Never Smoker  . Smokeless tobacco: Never Used   Vaping Use  . Vaping Use: Never used  Substance and Sexual Activity  . Alcohol use: Yes    Alcohol/week: 12.0 standard drinks    Types: 12 Shots of liquor per week  . Drug use: Never  . Sexual activity: Yes    Partners: Female  Other Topics Concern  . Not on file  Social History Narrative  . Not on file   Social Determinants of Health   Financial Resource Strain: Not on file  Food Insecurity: Not on file  Transportation Needs: Not on file  Physical Activity: Not on file  Stress: Not on file  Social Connections: Not on file  Intimate Partner Violence: Not on file    FAMILY HISTORY: Family History  Problem Relation Age of Onset  . Hypertension Mother   . Atrial fibrillation Father   . Schizophrenia Brother   . Depression Sister     ALLERGIES:  has No Known Allergies.  MEDICATIONS:  Current Outpatient Medications  Medication Sig Dispense Refill  . acetaminophen (TYLENOL) 325 MG tablet Take 2 tablets (650 mg total) by mouth every 6 (six) hours as needed for mild pain (or Fever >/= 101).    . Cholecalciferol 1.25 MG (50000 UT) capsule Take 1 capsule (50,000 Units total) by mouth once a week. 12 capsule 1  . feeding supplement (ENSURE SURGERY) LIQD Take  237 mLs by mouth 2 (two) times daily between meals. 14220 mL 0  . Melatonin 5 MG CHEW Chew 10 mg by mouth at bedtime.    Marland Kitchen oxyCODONE (OXY IR/ROXICODONE) 5 MG immediate release tablet Take 1 tablet (5 mg total) by mouth every 6 (six) hours as needed for moderate pain, severe pain or breakthrough pain. 20 tablet 0   No current facility-administered medications for this visit.    REVIEW OF SYSTEMS:   Constitutional: Denies unintentional weight loss, fatigue, fevers, chills or abnormal night sweats Eyes: Denies blurriness of vision, double vision or watery eyes Ears, nose, mouth, throat, and face: Denies mucositis or sore throat Respiratory: Denies cough, dyspnea or wheezes Cardiovascular: Denies palpitation, chest  discomfort or lower extremity swelling Gastrointestinal:  Denies nausea, vomiting, constipation, diarrhea, heartburn  (+) 1 week sensation of urgent BM  (+) BRBPR 11/24/2020  skin: Denies abnormal skin rashes Lymphatics: Denies new lymphadenopathy or easy bruising Neurological:Denies numbness, tingling or new weaknesses Behavioral/Psych: Mood is stable, no new changes  All other systems were reviewed with the patient and are negative.  PHYSICAL EXAMINATION: ECOG PERFORMANCE STATUS: 1 - Symptomatic but completely ambulatory  Vitals:   12/12/20 1057  BP: 111/61  Pulse: 72  Resp: 16  Temp: 98.2 F (36.8 C)  SpO2: 99%   Filed Weights   12/12/20 1057  Weight: 163 lb (73.9 kg)    GENERAL:alert, no distress and comfortable SKIN: No rash EYES:  sclera clear NECK: Without mass LYMPH:  no palpable cervical or supraclavicular lymphadenopathy  LUNGS: clear with normal breathing effort HEART: regular rate & rhythm, no lower extremity edema ABDOMEN:abdomen soft, non-tender and normal bowel sounds.  Laparoscopic and midline incisions closed with staples, no erythema or discharge.  Midline Dressing C/D/I PSYCH: alert & oriented x 3 with fluent speech NEURO: no focal motor/sensory deficits  LABORATORY DATA:  I have reviewed the data as listed CBC Latest Ref Rng & Units 12/02/2020 12/01/2020 11/30/2020  WBC 4.0 - 10.5 K/uL 8.4 11.4(H) 5.0  Hemoglobin 13.0 - 17.0 g/dL 10.5(L) 11.2(L) 11.7(L)  Hematocrit 39.0 - 52.0 % 31.1(L) 31.5(L) 33.1(L)  Platelets 150 - 400 K/uL 169 174 175   CMP Latest Ref Rng & Units 12/01/2020 11/30/2020 11/26/2020  Glucose 70 - 99 mg/dL 135(H) 206(H) 90  BUN 6 - 20 mg/dL 6 7 8   Creatinine 0.61 - 1.24 mg/dL 0.92 0.84 0.85  Sodium 135 - 145 mmol/L 138 137 138  Potassium 3.5 - 5.1 mmol/L 3.8 3.5 3.9  Chloride 98 - 111 mmol/L 104 103 102  CO2 22 - 32 mmol/L 26 27 28   Calcium 8.9 - 10.3 mg/dL 8.8(L) 9.0 9.0  Total Protein 6.5 - 8.1 g/dL 5.7(L) - -  Total Bilirubin 0.3 -  1.2 mg/dL 1.0 - -  Alkaline Phos 38 - 126 U/L 36(L) - -  AST 15 - 41 U/L 16 - -  ALT 0 - 44 U/L 12 - -    RADIOGRAPHIC STUDIES: I have personally reviewed the radiological images as listed and agreed with the findings in the report. CT ABDOMEN PELVIS W CONTRAST  Result Date: 11/24/2020 CLINICAL DATA:  Gastrointestinal bleeding with bright red blood in his stool this afternoon. EXAM: CT ABDOMEN AND PELVIS WITH CONTRAST TECHNIQUE: Multidetector CT imaging of the abdomen and pelvis was performed using the standard protocol following bolus administration of intravenous contrast. CONTRAST:  112m OMNIPAQUE IOHEXOL 300 MG/ML  SOLN COMPARISON:  None. FINDINGS: Lower chest: Unremarkable. Hepatobiliary: No focal liver abnormality is seen.  No gallstones, gallbladder wall thickening, or biliary dilatation. Pancreas: Unremarkable. No pancreatic ductal dilatation or surrounding inflammatory changes. Spleen: Normal in size without focal abnormality. Adrenals/Urinary Tract: Adrenal glands are unremarkable. Kidneys are normal, without renal calculi, focal lesion, or hydronephrosis. Bladder is unremarkable. Stomach/Bowel: Stomach is within normal limits. Appendix appears normal. No evidence of bowel wall thickening, distention, or inflammatory changes. Vascular/Lymphatic: No significant vascular findings are present. No enlarged abdominal or pelvic lymph nodes. Reproductive: Prostate is unremarkable. Other: No abdominal wall hernia or abnormality. No abdominopelvic ascites. Musculoskeletal: Normal appearing bones. IMPRESSION: Normal examination. Electronically Signed   By: Claudie Revering M.D.   On: 11/24/2020 16:24   CT Angio Abd/Pel w/ and/or w/o  Result Date: 11/25/2020 CLINICAL DATA:  Recent GI bleeding, history of hemorrhoids EXAM: CTA ABDOMEN AND PELVIS WITHOUT AND WITH CONTRAST TECHNIQUE: Multidetector CT imaging of the abdomen and pelvis was performed using the standard protocol during bolus administration of  intravenous contrast. Multiplanar reconstructed images and MIPs were obtained and reviewed to evaluate the vascular anatomy. CONTRAST:  143m OMNIPAQUE IOHEXOL 350 MG/ML SOLN COMPARISON:  CT from the previous day. FINDINGS: VASCULAR Aorta: Normal caliber aorta without aneurysm, dissection, vasculitis or significant stenosis. Celiac: Patent without evidence of aneurysm, dissection, vasculitis or significant stenosis. SMA: Patent without evidence of aneurysm, dissection, vasculitis or significant stenosis. Renals: Both renal arteries are patent without evidence of aneurysm, dissection, vasculitis, fibromuscular dysplasia or significant stenosis. IMA: Patent without evidence of aneurysm, dissection, vasculitis or significant stenosis. Iliacs: Patent without evidence of aneurysm, dissection, vasculitis or significant stenosis. Veins: No specific venous abnormality is noted. Review of the MIP images confirms the above findings. NON-VASCULAR Lower chest: No acute abnormality. Hepatobiliary: No focal liver abnormality is seen. No gallstones, gallbladder wall thickening, or biliary dilatation. Pancreas: Unremarkable. No pancreatic ductal dilatation or surrounding inflammatory changes. Spleen: Normal in size without focal abnormality. Adrenals/Urinary Tract: Adrenal glands are unremarkable. Kidneys are normal, without renal calculi, focal lesion, or hydronephrosis. Bladder is unremarkable. Stomach/Bowel: No obstructive or inflammatory changes are noted within the colon. No areas of contrast extravasation are identified to suggest acute hemorrhage. Small bowel and stomach are within normal limits. Lymphatic: No enlarged abdominal or pelvic lymph nodes. Reproductive: Prostate is unremarkable. Other: No abdominal wall hernia or abnormality. No abdominopelvic ascites. Musculoskeletal: No acute or significant osseous findings. IMPRESSION: VASCULAR No acute vascular abnormality is noted. No findings to suggest active GI hemorrhage  are seen. NON-VASCULAR No acute abnormality noted.  Unchanged from the previous day. Electronically Signed   By: MInez CatalinaM.D.   On: 11/25/2020 05:10    ASSESSMENT & PLAN: 35year old male with no significant PMH or family history  1.  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  He presented with 1 week history of urgent BM sensation and rectal bleeding x1. -Work-up showed a partially obstructing tumor in the sigmoid colon on first colonoscopy, CT was negative for distant metastasis, baseline CEA normal at 0.9 -He underwent surgical resection by Dr. MHassell Doneduring hospital admission, path showed 3 cm adenocarcinoma in the sigmoid colon, grade 2, 0/27 +lymph nodes, and clear margins.  There were no high risk features such as perforation, LVI, or PNI. -We discussed that his early stage II colon cancer was completely resected and adjuvant chemotherapy is not indicated, but does carry a low to moderate recurrence risk -We discussed the surveillance plan which includes  a colonoscopy at 1 year from surgery, lab and physical exam follow-up every 3 to 4 months in the first few years, then q6-12 months, and CT in 1 year.  He is agreeable to 5-year surveillance plan -We discussed the guardant reveal CT DNA test to further stratify his recurrence risk, he is agreeable, we will plan to draw at 4-6, 9-11, and 16-18 weeks postop.  He understands if ctDNA is detected this is associated with a very high risk of recurrence. -We discussed risk reducing behaviors such as healthy diet, abstaining from tobacco, limiting alcohol, and living a physically active lifestyle -I discussed given his recent abdominal surgery he is at risk for DVT and signs to monitor. He is active at home.  -Dr. Burr Medico recommends to start vitamin D for prevention.  We also recommend to start oral iron once  daily given that he was anemic Hgb lowest 10.4 while in the hospital.  We will repeat CBC and iron studies when he returns in 2 weeks for first ctDNA -He will f/up with Dr. Hassell Done next week, return for ctDNA/labs in 2, 7, and 16 weeks -f/up in 16 weeks   2. Genetics  -He has no prior personal or family history of cancer -path shows MMR normal and MSI-stable, this is not likely lynch syndrome related CRC -due to his young age at diagnosis, he is eligible for genetic testing and is interested. I referred him today  PLAN: -Lab, colonoscopy, CT, and surgical path reviewed -Begin cancer surveillance for stage IIA sigmoid colon cancer  -genetics and lab ~4/6 (ctDNA, CBC, CMP, CEA, and iron studies + genetic panel) -ctDNA 5/11 and 6/19 -surveillance f/up 6/29  -will cc note to Dr. Hassell Done -begin vitamin D and oral iron once daily, engage healthy lifestyle    Orders Placed This Encounter  Procedures  . CBC with Differential (Cancer Center Only)    Standing Status:   Standing    Number of Occurrences:   20    Standing Expiration Date:   12/12/2021  . CMP ([place] [facility] only)    Standing Status:   Standing    Number of Occurrences:   20    Standing Expiration Date:   12/12/2021  . Iron and TIBC    Standing Status:   Standing    Number of Occurrences:   20    Standing Expiration Date:   12/12/2021  . Ferritin    Standing Status:   Standing    Number of Occurrences:   20    Standing Expiration Date:   12/12/2021  . CEA (IN HOUSE-[facility])    Standing Status:   Standing    Number of Occurrences:   20    Standing Expiration Date:   12/12/2021  . Guardant 360    Guardant reveal drawn at 4-6, 9-11, 16-18 weeks post op (surgery 11/30/20)    Standing Status:   Standing    Number of Occurrences:   3    Standing Expiration Date:   12/12/2021  . Ambulatory referral to Genetics    Referral Priority:   Routine    Referral Type:   Consultation    Referral Reason:   Specialty Services Required    Number  of Visits Requested:   1    All questions were answered. The patient knows to call the clinic with any problems, questions or concerns.     Alla Feeling, NP 12/12/2020   Addendum  I have seen the patient, examined him. I agree  with the assessment and and plan and have edited the notes.   35 yo male without significant PMH or family history of malignancy presented with rectal bleeding and workup showed a T3N0 sigmoid colon cancer, MSS, s/p complete surgical resection. He had stage IIA adenocarcinoma, no other high risk features. We discuss his risk of recurrence which is low to moderate.  I do not recommend adjuvant chemo, unless if GuardantReveal is positive. I recommend genetic testing although he unlikely has Lynch Syndrome. We reviewed cancer surveillance, all questions were answered. He agrees with the plan.    Truitt Merle  12/12/2020

## 2020-12-13 NOTE — Progress Notes (Signed)
Met with patient and his wife Lilia Pro on medical oncology consult with [name] and Dr. Truitt Merle. I explained my role as nurse navigator.  Patient is being seen s/p colectomy on 11/30/2020 and is doing well after his surgery.  No navigation needs were identified.

## 2020-12-14 ENCOUNTER — Other Ambulatory Visit: Payer: Self-pay

## 2020-12-14 NOTE — Progress Notes (Signed)
The proposed treatment discussed in conference is for discussion purposes only and is not a binding recommendation.  The patients have not been physically examined, or presented with their treatment options.  Therefore, final treatment plans cannot be decided.   

## 2020-12-26 DIAGNOSIS — F4323 Adjustment disorder with mixed anxiety and depressed mood: Secondary | ICD-10-CM | POA: Diagnosis not present

## 2020-12-27 ENCOUNTER — Other Ambulatory Visit: Payer: Self-pay | Admitting: Genetic Counselor

## 2020-12-27 ENCOUNTER — Encounter: Payer: Self-pay | Admitting: Nurse Practitioner

## 2020-12-27 ENCOUNTER — Inpatient Hospital Stay (HOSPITAL_BASED_OUTPATIENT_CLINIC_OR_DEPARTMENT_OTHER): Payer: Federal, State, Local not specified - PPO | Admitting: Genetic Counselor

## 2020-12-27 ENCOUNTER — Other Ambulatory Visit: Payer: Self-pay

## 2020-12-27 ENCOUNTER — Inpatient Hospital Stay: Payer: Federal, State, Local not specified - PPO | Attending: Nurse Practitioner

## 2020-12-27 DIAGNOSIS — C187 Malignant neoplasm of sigmoid colon: Secondary | ICD-10-CM | POA: Diagnosis not present

## 2020-12-27 DIAGNOSIS — C189 Malignant neoplasm of colon, unspecified: Secondary | ICD-10-CM

## 2020-12-27 DIAGNOSIS — Z801 Family history of malignant neoplasm of trachea, bronchus and lung: Secondary | ICD-10-CM | POA: Diagnosis not present

## 2020-12-27 LAB — CBC WITH DIFFERENTIAL (CANCER CENTER ONLY)
Abs Immature Granulocytes: 0.01 10*3/uL (ref 0.00–0.07)
Basophils Absolute: 0 10*3/uL (ref 0.0–0.1)
Basophils Relative: 1 %
Eosinophils Absolute: 0.1 10*3/uL (ref 0.0–0.5)
Eosinophils Relative: 2 %
HCT: 39.4 % (ref 39.0–52.0)
Hemoglobin: 13.5 g/dL (ref 13.0–17.0)
Immature Granulocytes: 0 %
Lymphocytes Relative: 21 %
Lymphs Abs: 1.3 10*3/uL (ref 0.7–4.0)
MCH: 32.2 pg (ref 26.0–34.0)
MCHC: 34.3 g/dL (ref 30.0–36.0)
MCV: 94 fL (ref 80.0–100.0)
Monocytes Absolute: 0.4 10*3/uL (ref 0.1–1.0)
Monocytes Relative: 7 %
Neutro Abs: 4.4 10*3/uL (ref 1.7–7.7)
Neutrophils Relative %: 69 %
Platelet Count: 232 10*3/uL (ref 150–400)
RBC: 4.19 MIL/uL — ABNORMAL LOW (ref 4.22–5.81)
RDW: 11.9 % (ref 11.5–15.5)
WBC Count: 6.3 10*3/uL (ref 4.0–10.5)
nRBC: 0 % (ref 0.0–0.2)

## 2020-12-27 LAB — IRON AND TIBC
Iron: 116 ug/dL (ref 42–163)
Saturation Ratios: 31 % (ref 20–55)
TIBC: 380 ug/dL (ref 202–409)
UIBC: 264 ug/dL (ref 117–376)

## 2020-12-27 LAB — CMP (CANCER CENTER ONLY)
ALT: 15 U/L (ref 0–44)
AST: 17 U/L (ref 15–41)
Albumin: 4.5 g/dL (ref 3.5–5.0)
Alkaline Phosphatase: 60 U/L (ref 38–126)
Anion gap: 11 (ref 5–15)
BUN: 10 mg/dL (ref 6–20)
CO2: 30 mmol/L (ref 22–32)
Calcium: 9.4 mg/dL (ref 8.9–10.3)
Chloride: 102 mmol/L (ref 98–111)
Creatinine: 0.94 mg/dL (ref 0.61–1.24)
GFR, Estimated: >60 mL/min (ref >60)
Glucose, Bld: 84 mg/dL (ref 70–99)
Potassium: 4.4 mmol/L (ref 3.5–5.1)
Sodium: 143 mmol/L (ref 135–145)
Total Bilirubin: 0.8 mg/dL (ref 0.3–1.2)
Total Protein: 7.4 g/dL (ref 6.5–8.1)

## 2020-12-27 LAB — FERRITIN: Ferritin: 39 ng/mL (ref 24–336)

## 2020-12-27 LAB — GENETIC SCREENING ORDER

## 2020-12-27 LAB — CEA (IN HOUSE-CHCC): CEA (CHCC-In House): 1.27 ng/mL (ref 0.00–5.00)

## 2020-12-28 ENCOUNTER — Encounter: Payer: Self-pay | Admitting: Genetic Counselor

## 2020-12-28 DIAGNOSIS — Z801 Family history of malignant neoplasm of trachea, bronchus and lung: Secondary | ICD-10-CM | POA: Insufficient documentation

## 2020-12-28 NOTE — Progress Notes (Signed)
REFERRING PROVIDER: Alla Feeling, NP [place],  [place] [address]  PRIMARY PROVIDER:  Janith Lima, MD  PRIMARY REASON FOR VISIT:  1. Malignant neoplasm of colon, unspecified part of colon ([place])   2. Family history of lung cancer       HISTORY OF PRESENT ILLNESS:   Henry Wall, a 35 y.o. male, was seen for a [facility] cancer genetics consultation at the request of Cira Rue, NP due to a personal history of young onset colon cancer.  Henry Wall presents to clinic today to discuss the possibility of a hereditary predisposition to cancer, genetic testing, and to further clarify his future cancer risks, as well as potential cancer risks for family members.   In March of 2022, at the age of 80, Henry Wall was diagnosed with adenocarcinoma of the sigmoid colon. The tumor was microsatellite stable and MMR protein IHC was normal. The treatment plan included a sigmoid colectomy (completed 11/30/2020).   CANCER HISTORY:  Oncology History  Malignant tumor of colon ([place])  11/24/2020 Miscellaneous   FOBT+ in ED - admitted for further work up    11/24/2020 Imaging   CT AP with contrast IMPRESSION: Normal examination.   11/25/2020 Imaging   CTA abdomen/pelvis IMPRESSION: VASCULAR No acute vascular abnormality is noted. No findings to suggest active GI hemorrhage are seen. NON-VASCULAR No acute abnormality noted.  Unchanged from the previous day.   11/26/2020 Procedure   Colonoscopy, Dr. Wilfrid Lund  A fungating and infiltrative partially obstructing mass was found in the distal sigmoid colon, with the distal aspect about 18-20cm from the anal verge when insufflated. The mass was partially circumferential (involving one-half of the lumen circumference), and the adult colonoscope passed easily. The mass measured about four cm in length. Oozing was present due to friable tissue with scope passage. There was also high grade stiagmata of a visible vessel within the mass that was the  source of recent brisk bleeding.   11/26/2020 Tumor Marker   Baseline CEA: 0.9   11/30/2020 Definitive Surgery   Lap assisted sigmoid colectomy by Dr. Johnathan Hausen   11/30/2020 Pathology Results   FINAL MICROSCOPIC DIAGNOSIS:  A. COLON, SIGMOID, RESECTION:  -  Adenocarcinoma, moderately differentiated, 3 cm  -  No carcinoma identified in twenty-seven lymph nodes (0/27)  -  Margins uninvolved by carcinoma  Perforation-not identified Lymphovascular invasion-not identified Perineural invasion-not identified PT3PN0 MMR normal, MSI-stable   11/30/2020 Initial Diagnosis   Malignant tumor of colon ([place])   11/30/2020 Cancer Staging   Staging form: Colon and Rectum, AJCC 8th Edition - Pathologic stage from 11/30/2020: Stage IIA (pT3, pN0, cM0) - Signed by Alla Feeling, NP on 12/12/2020 Total positive nodes: 0 Histologic grading system: 4 grade system Histologic grade (G): G2 Laterality: Left Lymph-vascular invasion (LVI): LVI not present (absent)/not identified Perineural invasion (PNI): Absent Microsatellite instability (MSI): Stable      Past Medical History:  Diagnosis Date  . Family history of lung cancer     Past Surgical History:  Procedure Laterality Date  . COLONOSCOPY WITH PROPOFOL N/A 11/26/2020   Procedure: COLONOSCOPY WITH PROPOFOL;  Surgeon: Doran Stabler, MD;  Location: [facility];  Service: Gastroenterology;  Laterality: N/A;  . COLOSTOMY N/A 11/30/2020   Procedure: sigmoidectomy;  Surgeon: Johnathan Hausen, MD;  Location: [facility];  Service: General;  Laterality: N/A;  . SUBMUCOSAL TATTOO INJECTION  11/26/2020   Procedure: SUBMUCOSAL TATTOO INJECTION;  Surgeon: Doran Stabler, MD;  Location:  [facility];  Service: Gastroenterology;;  . WISDOM TOOTH EXTRACTION      Social History   Socioeconomic History  . Marital status: Married    Spouse name: Not on file  . Number of children: Not on file  . Years of education: Not on file  . Highest education level:  Not on file  Occupational History  . Not on file  Tobacco Use  . Smoking status: Never Smoker  . Smokeless tobacco: Never Used  Vaping Use  . Vaping Use: Never used  Substance and Sexual Activity  . Alcohol use: Yes    Alcohol/week: 12.0 standard drinks    Types: 12 Shots of liquor per week  . Drug use: Never  . Sexual activity: Yes    Partners: Female  Other Topics Concern  . Not on file  Social History Narrative  . Not on file   Social Determinants of Health   Financial Resource Strain: Not on file  Food Insecurity: Not on file  Transportation Needs: Not on file  Physical Activity: Not on file  Stress: Not on file  Social Connections: Not on file     FAMILY HISTORY:  We obtained a detailed, 4-generation family history.  Significant diagnoses are listed below: Family History  Problem Relation Age of Onset  . Hypertension Mother   . Atrial fibrillation Father   . Schizophrenia Brother   . Depression Sister   . Lung cancer Maternal Grandfather 17       heavy smoker   Henry Wall does not have children. He has one brother (age 55) and two sisters (ages 32 and 21). None of these relatives have had cancer.  Henry Wall mother is alive at age 45 without cancer. There is one maternal aunt, who has not had cancer and does not have children. Henry Wall maternal grandmother is alive at age 46 without cancer. His maternal grandfather died at age 84 with lung cancer (diagnosed age 59) and was a heavy smoker.  Henry Wall father is alive at age 50 without cancer. There is one paternal aunt and one paternal uncle. There is no known cancer among paternal aunts/uncles or paternal cousins. Henry Wall paternal grandmother is alive at age 69 without cancer. His paternal grandfather died at age 57 without cancer.  Henry Wall is unaware of previous family history of genetic testing for hereditary cancer risks. Patient's maternal ancestors are of [place] and [place] descent, and paternal ancestors  are of [place] and [place] descent. There is no reported Ashkenazi Jewish ancestry. There is no known consanguinity.  GENETIC COUNSELING ASSESSMENT: Henry Wall is a 35 y.o. male with a personal history of young onset colon cancer, which is somewhat suggestive of a hereditary cancer syndrome and predisposition to cancer. We, therefore, discussed and recommended the following at today's visit.   DISCUSSION: We discussed that approximately 5-10% of cancer is hereditary, with most cases of hereditary colorectal cancer associated with Lynch syndrome. Notably, Henry Wall tumor was microsatellite stable and mismatch repair protein immunohistochemistry was normal. This lowers, but cannot eliminate, the chance that Henry Wall could have Lynch syndrome. There are other genes that can be associated with hereditary colorectal cancer syndromes, including APC, MUTYH, CHEK2, etc. We discussed that testing is beneficial for several reasons, including knowing about other cancer risks, identifying potential screening and risk-reduction options that may be appropriate, and to understand if other family members could be at risk for cancer and allow them to undergo genetic testing.  We reviewed the  characteristics, features and inheritance patterns of hereditary cancer syndromes. We also discussed genetic testing, including the appropriate family members to test, the process of testing, insurance coverage and turn-around-time for results. We discussed the implications of a negative, positive and/or variant of uncertain significant result. We recommended Henry Wall pursue genetic testing for the [facility] CancerNext-Expanded + RNAinsight panel.   The CancerNext-Expanded + RNAinsight gene panel offered by [employer] and includes sequencing and rearrangement analysis for the following 77 genes: AIP, ALK, APC, ATM, AXIN2, BAP1, BARD1, BLM, BMPR1A, BRCA1, BRCA2, BRIP1, CDC73, CDH1, CDK4, CDKN1B, CDKN2A, CHEK2, CTNNA1, DICER1, FANCC, FH,  FLCN, GALNT12, KIF1B, LZTR1, MAX, MEN1, MET, MLH1, MSH2, MSH3, MSH6, MUTYH, NBN, NF1, NF2, NTHL1, PALB2, PHOX2B, PMS2, POT1, PRKAR1A, PTCH1, PTEN, RAD51C, RAD51D, RB1, RECQL, RET, SDHA, SDHAF2, SDHB, SDHC, SDHD, SMAD4, SMARCA4, SMARCB1, SMARCE1, STK11, SUFU, TMEM127, TP53, TSC1, TSC2, VHL and XRCC2 (sequencing and deletion/duplication); EGFR, EGLN1, HOXB13, KIT, MITF, PDGFRA, POLD1 and POLE (sequencing only); EPCAM and GREM1 (deletion/duplication only). RNA data is routinely analyzed for use in variant interpretation for all genes.  Based on Henry Wall personal history of young-onset colon cancer cancer, he meets medical criteria for genetic testing. Despite that he meets criteria, there may still be an out of pocket cost. We discussed that if his out of pocket cost for testing is over $100, the laboratory will reach out to let him know. If the out of pocket cost of testing is less than $100 he will be billed by the genetic testing laboratory.     PLAN: After considering the risks, benefits, and limitations, Henry Wall provided informed consent to pursue genetic testing and the blood sample was sent to [facility] for analysis of the CancerNext-Expanded + RNAinsight panel. Results should be available within approximately two-three weeks' time, at which point they will be disclosed by telephone to Henry Wall, as will any additional recommendations warranted by these results. Henry Wall will receive a summary of his genetic counseling visit and a copy of his results once available. This information will also be available in Epic.   Henry Wall questions were answered to his satisfaction today. Our contact information was provided should additional questions or concerns arise. Thank you for the referral and allowing [place] to share in the care of your patient.   Henry Wall, [place], [facility] Licensed, Certified Dispensing [email or web address] Phone: [phone]  The patient was seen for a  total of 25 minutes in face-to-face genetic counseling.  This patient was discussed with Drs. Magrinat, Lindi Adie and/or Burr Medico who agrees with the above.    _______________________________________________________________________ For Office Staff:  Number of people involved in session: 2 Was an Intern/ student involved with case: yes

## 2021-01-02 DIAGNOSIS — F4323 Adjustment disorder with mixed anxiety and depressed mood: Secondary | ICD-10-CM | POA: Diagnosis not present

## 2021-01-05 ENCOUNTER — Other Ambulatory Visit: Payer: Self-pay

## 2021-01-09 ENCOUNTER — Encounter: Payer: Self-pay | Admitting: Internal Medicine

## 2021-01-09 ENCOUNTER — Other Ambulatory Visit: Payer: Self-pay

## 2021-01-09 ENCOUNTER — Ambulatory Visit: Payer: Federal, State, Local not specified - PPO | Admitting: Internal Medicine

## 2021-01-09 VITALS — BP 116/70 | HR 74 | Temp 98.4°F | Ht 69.0 in | Wt 168.0 lb

## 2021-01-09 DIAGNOSIS — Z23 Encounter for immunization: Secondary | ICD-10-CM

## 2021-01-09 DIAGNOSIS — Z0001 Encounter for general adult medical examination with abnormal findings: Secondary | ICD-10-CM | POA: Diagnosis not present

## 2021-01-09 DIAGNOSIS — Z1159 Encounter for screening for other viral diseases: Secondary | ICD-10-CM

## 2021-01-09 DIAGNOSIS — C189 Malignant neoplasm of colon, unspecified: Secondary | ICD-10-CM

## 2021-01-09 NOTE — Progress Notes (Signed)
Subjective:  Patient ID: Santosh Petter, male    DOB: 20-Aug-1986  Age: 35 y.o. MRN: [id number]  CC: Follow-up  This visit occurred during the SARS-CoV-2 public health emergency.  Safety protocols were in place, including screening questions prior to the visit, additional usage of staff PPE, and extensive cleaning of exam room while observing appropriate contact time as indicated for disinfecting solutions.    HPI Domanick Cuccia presents for f/up -  He is entering grad school and needs to be screened for and vaccinated against communicable diseases.  Outpatient Medications Prior to Visit  Medication Sig Dispense Refill  . acetaminophen (TYLENOL) 325 MG tablet Take 2 tablets (650 mg total) by mouth every 6 (six) hours as needed for mild pain (or Fever >/= 101).    . Cholecalciferol 1.25 MG (50000 UT) capsule Take 1 capsule (50,000 Units total) by mouth once a week. 12 capsule 1  . Melatonin 5 MG CHEW Chew 10 mg by mouth at bedtime.    Marland Kitchen oxyCODONE (OXY IR/ROXICODONE) 5 MG immediate release tablet Take 1 tablet (5 mg total) by mouth every 6 (six) hours as needed for moderate pain, severe pain or breakthrough pain. 20 tablet 0   No facility-administered medications prior to visit.    ROS Review of Systems  All other systems reviewed and are negative.   Objective:  BP 116/70   Pulse 74   Temp 98.4 F (36.9 C) (Oral)   Ht 5\' 9"  (1.753 m)   Wt 168 lb (76.2 kg)   SpO2 99%   BMI 24.81 kg/m   BP Readings from Last 3 Encounters:  01/09/21 116/70  12/12/20 111/61  12/02/20 106/60    Wt Readings from Last 3 Encounters:  01/09/21 168 lb (76.2 kg)  12/12/20 163 lb (73.9 kg)  11/30/20 164 lb 14.5 oz (74.8 kg)    Physical Exam Vitals reviewed.  HENT:     Nose: Nose normal.     Mouth/Throat:     Mouth: Mucous membranes are moist.  Eyes:     General: No scleral icterus.    Conjunctiva/sclera: Conjunctivae normal.  Cardiovascular:     Rate and Rhythm: Normal rate and regular rhythm.      Heart sounds: No murmur heard.   Pulmonary:     Effort: Pulmonary effort is normal.     Breath sounds: No stridor. No wheezing, rhonchi or rales.  Abdominal:     General: Abdomen is flat. Bowel sounds are normal. There is no distension.     Palpations: Abdomen is soft. There is no hepatomegaly, splenomegaly or mass.     Tenderness: There is no abdominal tenderness.  Musculoskeletal:        General: Normal range of motion.     Cervical back: Neck supple.     Right lower leg: No edema.     Left lower leg: No edema.  Lymphadenopathy:     Cervical: No cervical adenopathy.  Skin:    General: Skin is warm and dry.     Coloration: Skin is not pale.  Neurological:     General: No focal deficit present.     Mental Status: He is alert.  Psychiatric:        Mood and Affect: Mood normal.        Behavior: Behavior normal.     Lab Results  Component Value Date   WBC 6.3 12/27/2020   HGB 13.5 12/27/2020   HCT 39.4 12/27/2020   PLT 232 12/27/2020  GLUCOSE 84 12/27/2020   CHOL 176 10/18/2020   TRIG 45.0 10/18/2020   HDL 74.80 10/18/2020   LDLCALC 93 10/18/2020   ALT 15 12/27/2020   AST 17 12/27/2020   NA 143 12/27/2020   K 4.4 12/27/2020   CL 102 12/27/2020   CREATININE 0.94 12/27/2020   BUN 10 12/27/2020   CO2 30 12/27/2020   TSH 1.28 10/18/2020   INR 1.1 11/25/2020    CT Angio Abd/Pel w/ and/or w/o  Result Date: 11/25/2020 CLINICAL DATA:  Recent GI bleeding, history of hemorrhoids EXAM: CTA ABDOMEN AND PELVIS WITHOUT AND WITH CONTRAST TECHNIQUE: Multidetector CT imaging of the abdomen and pelvis was performed using the standard protocol during bolus administration of intravenous contrast. Multiplanar reconstructed images and MIPs were obtained and reviewed to evaluate the vascular anatomy. CONTRAST:  129mL OMNIPAQUE IOHEXOL 350 MG/ML SOLN COMPARISON:  CT from the previous day. FINDINGS: VASCULAR Aorta: Normal caliber aorta without aneurysm, dissection, vasculitis or  significant stenosis. Celiac: Patent without evidence of aneurysm, dissection, vasculitis or significant stenosis. SMA: Patent without evidence of aneurysm, dissection, vasculitis or significant stenosis. Renals: Both renal arteries are patent without evidence of aneurysm, dissection, vasculitis, fibromuscular dysplasia or significant stenosis. IMA: Patent without evidence of aneurysm, dissection, vasculitis or significant stenosis. Iliacs: Patent without evidence of aneurysm, dissection, vasculitis or significant stenosis. Veins: No specific venous abnormality is noted. Review of the MIP images confirms the above findings. NON-VASCULAR Lower chest: No acute abnormality. Hepatobiliary: No focal liver abnormality is seen. No gallstones, gallbladder wall thickening, or biliary dilatation. Pancreas: Unremarkable. No pancreatic ductal dilatation or surrounding inflammatory changes. Spleen: Normal in size without focal abnormality. Adrenals/Urinary Tract: Adrenal glands are unremarkable. Kidneys are normal, without renal calculi, focal lesion, or hydronephrosis. Bladder is unremarkable. Stomach/Bowel: No obstructive or inflammatory changes are noted within the colon. No areas of contrast extravasation are identified to suggest acute hemorrhage. Small bowel and stomach are within normal limits. Lymphatic: No enlarged abdominal or pelvic lymph nodes. Reproductive: Prostate is unremarkable. Other: No abdominal wall hernia or abnormality. No abdominopelvic ascites. Musculoskeletal: No acute or significant osseous findings. IMPRESSION: VASCULAR No acute vascular abnormality is noted. No findings to suggest active GI hemorrhage are seen. NON-VASCULAR No acute abnormality noted.  Unchanged from the previous day. Electronically Signed   By: Inez Catalina M.D.   On: 11/25/2020 05:10    Assessment & Plan:   Javonte was seen today for follow-up.  Diagnoses and all orders for this visit:  Encounter for general adult medical  examination with abnormal findings -     Rubella screen; Future -     Varicella zoster antibody, IgG; Future -     Hepatitis A antibody, total; Future -     Mumps antibody, IgG; Future -     QuantiFERON-TB Gold Plus; Future -     Rubeola antibody IgG; Future -     Rubeola antibody IgG -     QuantiFERON-TB Gold Plus -     Mumps antibody, IgG -     Hepatitis A antibody, total -     Varicella zoster antibody, IgG -     Rubella screen  Other orders -     Tdap vaccine greater than or equal to 7yo IM   I am having Dolores Frame maintain his Cholecalciferol, Melatonin, oxyCODONE, and acetaminophen.  No orders of the defined types were placed in this encounter.    Follow-up: No follow-ups on file.  Scarlette Calico, MD

## 2021-01-10 ENCOUNTER — Encounter: Payer: Self-pay | Admitting: Internal Medicine

## 2021-01-11 LAB — GUARDANT 360

## 2021-01-12 ENCOUNTER — Telehealth: Payer: Self-pay

## 2021-01-12 ENCOUNTER — Encounter: Payer: Self-pay | Admitting: Internal Medicine

## 2021-01-12 LAB — RUBEOLA ANTIBODY IGG: Rubeola IgG: >300 AU/mL

## 2021-01-12 LAB — QUANTIFERON-TB GOLD PLUS
Mitogen-NIL: >10 IU/mL
NIL: 0.02 IU/mL
QuantiFERON-TB Gold Plus: NEGATIVE
TB1-NIL: <0 IU/mL
TB2-NIL: 0 IU/mL

## 2021-01-12 LAB — MUMPS ANTIBODY, IGG: Mumps IgG: 131 AU/mL

## 2021-01-12 LAB — RUBELLA SCREEN: Rubella: 8.74 Index

## 2021-01-12 LAB — HEPATITIS A ANTIBODY, TOTAL: Hepatitis A AB,Total: REACTIVE — AB

## 2021-01-12 LAB — VARICELLA ZOSTER ANTIBODY, IGG: Varicella IgG: 1532 index

## 2021-01-12 NOTE — Telephone Encounter (Signed)
I left a message on Henry Wall VM per DPR with his Guardant Reveal test results. I informed him that his results showed ctDNA not detected at this time which is associated with a lower risk of cancer recurrence. I advised Henry Wall to call us with any questions or concerns.

## 2021-01-16 ENCOUNTER — Telehealth: Payer: Self-pay | Admitting: *Deleted

## 2021-01-16 DIAGNOSIS — F4323 Adjustment disorder with mixed anxiety and depressed mood: Secondary | ICD-10-CM | POA: Diagnosis not present

## 2021-01-16 NOTE — Telephone Encounter (Signed)
Per Cira Rue, NP, called pt to make aware of results. Left message on vm for pt to call office back.

## 2021-01-16 NOTE — Progress Notes (Signed)
Please let him know first circulating tumor DNA test is NOT DETECTED, great news. Remind him next lab 5/10, then lab and f/up 6/28.  Thanks, Regan Rakers

## 2021-01-23 DIAGNOSIS — Z1379 Encounter for other screening for genetic and chromosomal anomalies: Secondary | ICD-10-CM | POA: Insufficient documentation

## 2021-01-23 DIAGNOSIS — F4323 Adjustment disorder with mixed anxiety and depressed mood: Secondary | ICD-10-CM | POA: Diagnosis not present

## 2021-01-25 ENCOUNTER — Ambulatory Visit: Payer: Self-pay | Admitting: Genetic Counselor

## 2021-01-25 ENCOUNTER — Encounter: Payer: Self-pay | Admitting: Genetic Counselor

## 2021-01-25 ENCOUNTER — Telehealth: Payer: Self-pay | Admitting: Genetic Counselor

## 2021-01-25 DIAGNOSIS — Z1379 Encounter for other screening for genetic and chromosomal anomalies: Secondary | ICD-10-CM

## 2021-01-25 NOTE — Telephone Encounter (Signed)
Revealed negative genetic testing. Discussed that we do not know why he had colon cancer at a young age or why there is cancer in the family. It is possible that there could be a mutation in a different gene that we are not testing, or our current technology may not be able to detect certain mutations. It will therefore be important for him to stay in contact with genetics to keep up with whether additional testing may be appropriate in the future.   Two variants of uncertain significance were detected - one in the ALK gene called p.R1212H and a second in the APC gene called 5'UTR_EX4dup. His result is still considered normal at this time and should not impact his medical management.

## 2021-01-25 NOTE — Progress Notes (Signed)
HPI:  [name] was previously seen in the [place] clinic due to a personal and family history of cancer and concerns regarding a hereditary predisposition to cancer. Please refer to our prior cancer genetics clinic note for more information regarding our discussion, assessment and recommendations, at the time. [name] recent genetic test results were disclosed to him, as were recommendations warranted by these results. These results and recommendations are discussed in more detail below.  CANCER HISTORY:  Oncology History  Malignant tumor of colon ([place])  11/24/2020 Miscellaneous   FOBT+ in ED - admitted for further work up    11/24/2020 Imaging   CT AP with contrast IMPRESSION: Normal examination.   11/25/2020 Imaging   CTA abdomen/pelvis IMPRESSION: VASCULAR No acute vascular abnormality is noted. No findings to suggest active GI hemorrhage are seen. NON-VASCULAR No acute abnormality noted.  Unchanged from the previous day.   11/26/2020 Procedure   Colonoscopy, Dr. Wilfrid Lund  A fungating and infiltrative partially obstructing mass was found in the distal sigmoid colon, with the distal aspect about 18-20cm from the anal verge when insufflated. The mass was partially circumferential (involving one-half of the lumen circumference), and the adult colonoscope passed easily. The mass measured about four cm in length. Oozing was present due to friable tissue with scope passage. There was also high grade stiagmata of a visible vessel within the mass that was the source of recent brisk bleeding.   11/26/2020 Tumor Marker   Baseline CEA: 0.9   11/30/2020 Definitive Surgery   Lap assisted sigmoid colectomy by Dr. Johnathan Hausen   11/30/2020 Pathology Results   FINAL MICROSCOPIC DIAGNOSIS:  A. COLON, SIGMOID, RESECTION:  -  Adenocarcinoma, moderately differentiated, 3 cm  -  No carcinoma identified in twenty-seven lymph nodes (0/27)  -  Margins uninvolved by carcinoma   Perforation-not identified Lymphovascular invasion-not identified Perineural invasion-not identified PT3PN0 MMR normal, MSI-stable   11/30/2020 Initial Diagnosis   Malignant tumor of colon ([place])   11/30/2020 Cancer Staging   Staging form: Colon and Rectum, AJCC 8th Edition - Pathologic stage from 11/30/2020: Stage IIA (pT3, pN0, cM0) - Signed by Alla Feeling, NP on 12/12/2020 Total positive nodes: 0 Histologic grading system: 4 grade system Histologic grade (G): G2 Laterality: Left Lymph-vascular invasion (LVI): LVI not present (absent)/not identified Perineural invasion (PNI): Absent Microsatellite instability (MSI): Stable   01/23/2021 Genetic Testing   Negative genetic testing:  No pathogenic variants detected on the [employer] CancerNext-Expanded + RNAinsight panel. Two variants of uncertain significance (VUS) were detected - one in the ALK gene called p.R1212H and a second in the APC gene called 5'UTR_EX4dup. The report date is 01/23/2021.  The CancerNext-Expanded + RNAinsight gene panel offered by [employer] and includes sequencing and rearrangement analysis for the following 77 genes: AIP, ALK, APC, ATM, AXIN2, BAP1, BARD1, BLM, BMPR1A, BRCA1, BRCA2, BRIP1, CDC73, CDH1, CDK4, CDKN1B, CDKN2A, CHEK2, CTNNA1, DICER1, FANCC, FH, FLCN, GALNT12, KIF1B, LZTR1, MAX, MEN1, MET, MLH1, MSH2, MSH3, MSH6, MUTYH, NBN, NF1, NF2, NTHL1, PALB2, PHOX2B, PMS2, POT1, PRKAR1A, PTCH1, PTEN, RAD51C, RAD51D, RB1, RECQL, RET, SDHA, SDHAF2, SDHB, SDHC, SDHD, SMAD4, SMARCA4, SMARCB1, SMARCE1, STK11, SUFU, TMEM127, TP53, TSC1, TSC2, VHL and XRCC2 (sequencing and deletion/duplication); EGFR, EGLN1, HOXB13, KIT, MITF, PDGFRA, POLD1 and POLE (sequencing only); EPCAM and GREM1 (deletion/duplication only). RNA data is routinely analyzed for use in variant interpretation for all genes.      FAMILY HISTORY:  We obtained a detailed, 4-generation family history.  Significant diagnoses are listed  below: Family History   Problem Relation Age of Onset  . Hypertension Mother   . Atrial fibrillation Father   . Schizophrenia Brother   . Depression Sister   . Lung cancer Maternal Grandfather 29       heavy smoker   [name] does not have children. He has one brother (age 48) and two sisters (ages 72 and 56). None of these relatives have had cancer.  [name] mother is alive at age 42 without cancer. There is one maternal aunt, who has not had cancer and does not have children. [name] maternal grandmother is alive at age 10 without cancer. His maternal grandfather died at age 30 with lung cancer (diagnosed age 59) and was a heavy smoker.  [name] father is alive at age 49 without cancer. There is one paternal aunt and one paternal uncle. There is no known cancer among paternal aunts/uncles or paternal cousins. [name] paternal grandmother is alive at age 14 without cancer. His paternal grandfather died at age 3 without cancer.  [name] is unaware of previous family history of genetic testing for hereditary cancer risks. Patient's maternal ancestors are of [place] and [place] descent, and paternal ancestors are of [place] and [place] descent. There is no reported Ashkenazi Jewish ancestry. There is no known consanguinity.  GENETIC TEST RESULTS: Genetic testing reported out on 01/23/2021 through the [employer] CancerNext-Expanded + RNAinsight panel. No pathogenic variants were detected.   The CancerNext-Expanded + RNAinsight gene panel offered by [employer] and includes sequencing and rearrangement analysis for the following 77 genes: AIP, ALK, APC, ATM, AXIN2, BAP1, BARD1, BLM, BMPR1A, BRCA1, BRCA2, BRIP1, CDC73, CDH1, CDK4, CDKN1B, CDKN2A, CHEK2, CTNNA1, DICER1, FANCC, FH, FLCN, GALNT12, KIF1B, LZTR1, MAX, MEN1, MET, MLH1, MSH2, MSH3, MSH6, MUTYH, NBN, NF1, NF2, NTHL1, PALB2, PHOX2B, PMS2, POT1, PRKAR1A, PTCH1, PTEN, RAD51C, RAD51D, RB1, RECQL, RET, SDHA, SDHAF2, SDHB, SDHC, SDHD, SMAD4, SMARCA4, SMARCB1,  SMARCE1, STK11, SUFU, TMEM127, TP53, TSC1, TSC2, VHL and XRCC2 (sequencing and deletion/duplication); EGFR, EGLN1, HOXB13, KIT, MITF, PDGFRA, POLD1 and POLE (sequencing only); EPCAM and GREM1 (deletion/duplication only). RNA data is routinely analyzed for use in variant interpretation for all genes. The test report will be scanned into EPIC and located under the Molecular Pathology section of the Results Review tab.  A portion of the result report is included below for reference.     We discussed with [name] that because current genetic testing is not perfect, it is possible there may be a gene mutation in one of these genes that current testing cannot detect, but that chance is small.  We also discussed that there could be another gene that has not yet been discovered, or that we have not yet tested, that is responsible for the cancer diagnoses in the family. It is also possible there is a hereditary cause for the cancer in the family that [name] did not inherit and therefore was not identified in his testing.  Therefore, it is important to remain in touch with cancer genetics in the future so that we can continue to offer [name] the most up to date genetic testing.   Genetic testing did identify two variants of uncertain significance (VUS) - one in the ALK gene called p.R1212H and a second in the APC gene called 5'UTR_EX4dup. At this time, it is unknown if these variants are associated with increased cancer risk or if they are normal findings, but most variants such as these get reclassified to being inconsequential. They  should not be used to make medical management decisions. With time, we suspect the lab will determine the significance of these variants, if any. If we do learn more about them, we will try to contact [name] to discuss it further. However, it is important to stay in touch with [place] periodically and keep the address and phone number up to date.  CANCER SCREENING RECOMMENDATIONS: Mr.  Henry Wall test result is considered negative (normal).  This means that we have not identified a hereditary cause for his personal and family history of cancer at this time. While reassuring, this does not definitively rule out a hereditary predisposition to cancer. It is still possible that there could be genetic mutations that are undetectable by current technology. There could be genetic mutations in genes that have not been tested or identified to increase cancer risk.  Therefore, it is recommended he continue to follow the cancer management and screening guidelines provided by his oncology and primary healthcare provider.   An individual's cancer risk and medical management are not determined by genetic test results alone. Overall cancer risk assessment incorporates additional factors, including personal medical history, family history, and any available genetic information that may result in a personalized plan for cancer prevention and surveillance.  This negative genetic test simply tells [place] that we cannot yet define why [name] has had colorectal cancer at a young age. [name] medical management and screening should be based on the prospect that he may be at an increased risk for a second colorectal cancer in the future and should, therefore, undergo more frequent colonoscopy screening at intervals determined by his GI providers.   RECOMMENDATIONS FOR FAMILY MEMBERS:  Individuals in this family might be at some increased risk of developing cancer, over the general population risk, simply due to the family history of cancer.  We recommended women in this family have a yearly mammogram beginning at age 61, or 33 years younger than the earliest onset of cancer, an annual clinical breast exam, and perform monthly breast self-exams. Women in this family should also have a gynecological exam as recommended by their primary provider. All family members should be referred for colonoscopy starting at age  1.  Per the NCCN Guidelines (Colorectal Cancer Screening Guidelines, Version 1.2022), all first-degree relatives should have a colonoscopy every 5 years, or as determined by their GI doctors, beginning at age 72 (37 years younger than the earliest diagnosis).  FOLLOW-UP: Lastly, we discussed with [name] that cancer genetics is a rapidly advancing field and it is possible that new genetic tests will be appropriate for him and/or his family members in the future. We encouraged him to remain in contact with cancer genetics on an annual basis so we can update his personal and family histories and let him know of advances in cancer genetics that may benefit this family.   Our contact number was provided. [name] questions were answered to his satisfaction, and he knows he is welcome to call us at anytime with additional questions or concerns.   Clint Guy, MS, [facility] Genetic Counselor [email or web address] Phone: [phone]

## 2021-01-30 DIAGNOSIS — F4323 Adjustment disorder with mixed anxiety and depressed mood: Secondary | ICD-10-CM | POA: Diagnosis not present

## 2021-01-31 ENCOUNTER — Encounter: Payer: Self-pay | Admitting: Nurse Practitioner

## 2021-01-31 ENCOUNTER — Inpatient Hospital Stay: Payer: Federal, State, Local not specified - PPO | Attending: Nurse Practitioner

## 2021-01-31 ENCOUNTER — Other Ambulatory Visit: Payer: Self-pay

## 2021-01-31 DIAGNOSIS — C189 Malignant neoplasm of colon, unspecified: Secondary | ICD-10-CM

## 2021-01-31 DIAGNOSIS — C187 Malignant neoplasm of sigmoid colon: Secondary | ICD-10-CM | POA: Insufficient documentation

## 2021-01-31 LAB — CBC WITH DIFFERENTIAL (CANCER CENTER ONLY)
Abs Immature Granulocytes: 0.01 10*3/uL (ref 0.00–0.07)
Basophils Absolute: 0 10*3/uL (ref 0.0–0.1)
Basophils Relative: 0 %
Eosinophils Absolute: 0.1 10*3/uL (ref 0.0–0.5)
Eosinophils Relative: 2 %
HCT: 39.8 % (ref 39.0–52.0)
Hemoglobin: 13.5 g/dL (ref 13.0–17.0)
Immature Granulocytes: 0 %
Lymphocytes Relative: 37 %
Lymphs Abs: 1.8 10*3/uL (ref 0.7–4.0)
MCH: 31.8 pg (ref 26.0–34.0)
MCHC: 33.9 g/dL (ref 30.0–36.0)
MCV: 93.9 fL (ref 80.0–100.0)
Monocytes Absolute: 0.3 10*3/uL (ref 0.1–1.0)
Monocytes Relative: 7 %
Neutro Abs: 2.6 10*3/uL (ref 1.7–7.7)
Neutrophils Relative %: 54 %
Platelet Count: 181 10*3/uL (ref 150–400)
RBC: 4.24 MIL/uL (ref 4.22–5.81)
RDW: 11.8 % (ref 11.5–15.5)
WBC Count: 4.8 10*3/uL (ref 4.0–10.5)
nRBC: 0 % (ref 0.0–0.2)

## 2021-02-13 ENCOUNTER — Telehealth: Payer: Self-pay | Admitting: Internal Medicine

## 2021-02-13 NOTE — Telephone Encounter (Signed)
FYI  Patient calling to report he tested positive for COVID 5/23 Runny nose only

## 2021-02-13 NOTE — Telephone Encounter (Signed)
Pt has been informed. I also suggested to give [place] a call if he gets any new or worsening symptoms for further instruction.

## 2021-02-15 LAB — GUARDANT 360

## 2021-02-16 ENCOUNTER — Telehealth: Payer: Self-pay

## 2021-02-16 NOTE — Telephone Encounter (Signed)
This nurse left a message for patient to return call to clinic related to [place] results per Lanora Manis, NP.

## 2021-02-16 NOTE — Telephone Encounter (Signed)
-----   Message from Alla Feeling, NP sent at 02/15/2021  1:26 PM EDT ----- Please let him know guardant reveal ctDNA is not detected, and CBC looks great. All good news.  Next lab and f/up 6/28.  Thanks, Regan Rakers

## 2021-03-13 DIAGNOSIS — F4323 Adjustment disorder with mixed anxiety and depressed mood: Secondary | ICD-10-CM | POA: Diagnosis not present

## 2021-03-21 ENCOUNTER — Inpatient Hospital Stay: Payer: Federal, State, Local not specified - PPO | Attending: Nurse Practitioner

## 2021-03-21 ENCOUNTER — Other Ambulatory Visit: Payer: Self-pay

## 2021-03-21 ENCOUNTER — Inpatient Hospital Stay: Payer: Federal, State, Local not specified - PPO | Admitting: Nurse Practitioner

## 2021-03-21 ENCOUNTER — Encounter: Payer: Self-pay | Admitting: Nurse Practitioner

## 2021-03-21 VITALS — BP 114/95 | HR 68 | Temp 97.4°F | Resp 18 | Ht 69.0 in | Wt 167.3 lb

## 2021-03-21 DIAGNOSIS — C189 Malignant neoplasm of colon, unspecified: Secondary | ICD-10-CM | POA: Diagnosis not present

## 2021-03-21 DIAGNOSIS — C187 Malignant neoplasm of sigmoid colon: Secondary | ICD-10-CM | POA: Insufficient documentation

## 2021-03-21 DIAGNOSIS — D509 Iron deficiency anemia, unspecified: Secondary | ICD-10-CM | POA: Diagnosis not present

## 2021-03-21 LAB — CBC WITH DIFFERENTIAL (CANCER CENTER ONLY)
Abs Immature Granulocytes: 0.01 10*3/uL (ref 0.00–0.07)
Basophils Absolute: 0 10*3/uL (ref 0.0–0.1)
Basophils Relative: 0 %
Eosinophils Absolute: 0.1 10*3/uL (ref 0.0–0.5)
Eosinophils Relative: 3 %
HCT: 41.1 % (ref 39.0–52.0)
Hemoglobin: 14.2 g/dL (ref 13.0–17.0)
Immature Granulocytes: 0 %
Lymphocytes Relative: 37 %
Lymphs Abs: 1.6 10*3/uL (ref 0.7–4.0)
MCH: 31.5 pg (ref 26.0–34.0)
MCHC: 34.5 g/dL (ref 30.0–36.0)
MCV: 91.1 fL (ref 80.0–100.0)
Monocytes Absolute: 0.4 10*3/uL (ref 0.1–1.0)
Monocytes Relative: 8 %
Neutro Abs: 2.3 10*3/uL (ref 1.7–7.7)
Neutrophils Relative %: 52 %
Platelet Count: 143 10*3/uL — ABNORMAL LOW (ref 150–400)
RBC: 4.51 MIL/uL (ref 4.22–5.81)
RDW: 12.3 % (ref 11.5–15.5)
WBC Count: 4.5 10*3/uL (ref 4.0–10.5)
nRBC: 0 % (ref 0.0–0.2)

## 2021-03-21 NOTE — Progress Notes (Signed)
[name]   Telephone:([phone] Fax:([phone]) [phone]   Clinic Follow up Note   Patient Care Team: Janith Lima, MD as PCP - General (Internal Medicine) Truitt Merle, MD as Consulting Physician (Oncology) Alla Feeling, NP as Nurse Practitioner (Nurse Practitioner) 03/21/2021  CHIEF COMPLAINT: Follow up stage II colon cancer   SUMMARY OF ONCOLOGIC HISTORY: Oncology History  Malignant tumor of colon ([place])  11/24/2020 Miscellaneous   FOBT+ in ED - admitted for further work up    11/24/2020 Imaging   CT AP with contrast IMPRESSION: Normal examination.   11/25/2020 Imaging   CTA abdomen/pelvis IMPRESSION: VASCULAR No acute vascular abnormality is noted. No findings to suggest active GI hemorrhage are seen. NON-VASCULAR No acute abnormality noted.  Unchanged from the previous day.   11/26/2020 Procedure   Colonoscopy, Dr. Wilfrid Lund  A fungating and infiltrative partially obstructing mass was found in the distal sigmoid colon, with the distal aspect about 18-20cm from the anal verge when insufflated. The mass was partially circumferential (involving one-half of the lumen circumference), and the adult colonoscope passed easily. The mass measured about four cm in length. Oozing was present due to friable tissue with scope passage. There was also high grade stiagmata of a visible vessel within the mass that was the source of recent brisk bleeding.   11/26/2020 Tumor Marker   Baseline CEA: 0.9   11/30/2020 Definitive Surgery   Lap assisted sigmoid colectomy by Dr. Johnathan Hausen   11/30/2020 Pathology Results   FINAL MICROSCOPIC DIAGNOSIS:  A. COLON, SIGMOID, RESECTION:  -  Adenocarcinoma, moderately differentiated, 3 cm  -  No carcinoma identified in twenty-seven lymph nodes (0/27)  -  Margins uninvolved by carcinoma  Perforation-not identified Lymphovascular invasion-not identified Perineural invasion-not identified PT3PN0 MMR normal, MSI-stable   11/30/2020  Initial Diagnosis   Malignant tumor of colon (Henry Wall)    11/30/2020 Cancer Staging   Staging form: Colon and Rectum, AJCC 8th Edition - Pathologic stage from 11/30/2020: Stage IIA (pT3, pN0, cM0) - Signed by Alla Feeling, NP on 12/12/2020  Total positive nodes: 0  Histologic grading system: 4 grade system  Histologic grade (G): G2  Laterality: Left  Lymph-vascular invasion (LVI): LVI not present (absent)/not identified  Perineural invasion (PNI): Absent  Microsatellite instability (MSI): Stable    01/23/2021 Genetic Testing   Negative genetic testing:  No pathogenic variants detected on the [employer] CancerNext-Expanded + RNAinsight panel. Two variants of uncertain significance (VUS) were detected - one in the ALK gene called p.R1212H and a second in the APC gene called 5'UTR_EX4dup. The report date is 01/23/2021.  The CancerNext-Expanded + RNAinsight gene panel offered by [employer] and includes sequencing and rearrangement analysis for the following 77 genes: AIP, ALK, APC, ATM, AXIN2, BAP1, BARD1, BLM, BMPR1A, BRCA1, BRCA2, BRIP1, CDC73, CDH1, CDK4, CDKN1B, CDKN2A, CHEK2, CTNNA1, DICER1, FANCC, FH, FLCN, GALNT12, KIF1B, LZTR1, MAX, MEN1, MET, MLH1, MSH2, MSH3, MSH6, MUTYH, NBN, NF1, NF2, NTHL1, PALB2, PHOX2B, PMS2, POT1, PRKAR1A, PTCH1, PTEN, RAD51C, RAD51D, RB1, RECQL, RET, SDHA, SDHAF2, SDHB, SDHC, SDHD, SMAD4, SMARCA4, SMARCB1, SMARCE1, STK11, SUFU, TMEM127, TP53, TSC1, TSC2, VHL and XRCC2 (sequencing and deletion/duplication); EGFR, EGLN1, HOXB13, KIT, MITF, PDGFRA, POLD1 and POLE (sequencing only); EPCAM and GREM1 (deletion/duplication only). RNA data is routinely analyzed for use in variant interpretation for all genes.      CURRENT THERAPY: Surveillance   INTERVAL HISTORY: Ms. [name] returns for follow up as scheduled. He was last seen for initial consult on 12/12/20 and began  surveillance. ctDNA tests have been negative/not detected.  He feels well, energy and appetite are normal.   Denies unintentional weight loss.  After surgery, he was having 3 bowel movements per day, now 1-2.  Denies bleeding, abdominal pain or bloating, nausea/vomiting, or other new concerns.    MEDICAL HISTORY:  Past Medical History:  Diagnosis Date   Family history of lung cancer     SURGICAL HISTORY: Past Surgical History:  Procedure Laterality Date   COLONOSCOPY WITH PROPOFOL N/A 11/26/2020   Procedure: COLONOSCOPY WITH PROPOFOL;  Surgeon: Doran Stabler, MD;  Location: [facility];  Service: Gastroenterology;  Laterality: N/A;   COLOSTOMY N/A 11/30/2020   Procedure: sigmoidectomy;  Surgeon: Johnathan Hausen, MD;  Location: [facility] ORS;  Service: General;  Laterality: N/A;   SUBMUCOSAL TATTOO INJECTION  11/26/2020   Procedure: SUBMUCOSAL TATTOO INJECTION;  Surgeon: Doran Stabler, MD;  Location: [facility];  Service: Gastroenterology;;   WISDOM TOOTH EXTRACTION      I have reviewed the social history and family history with the patient and they are unchanged from previous note.  ALLERGIES:  has No Known Allergies.  MEDICATIONS:  Current Outpatient Medications  Medication Sig Dispense Refill   Cholecalciferol 1.25 MG (50000 UT) capsule Take 1 capsule (50,000 Units total) by mouth once a week. 12 capsule 1   Melatonin 5 MG CHEW Chew 10 mg by mouth at bedtime.     No current facility-administered medications for this visit.    PHYSICAL EXAMINATION: ECOG PERFORMANCE STATUS: 0 - Asymptomatic  Vitals:   03/21/21 0956  BP: (!) 114/95  Pulse: 68  Resp: 18  Temp: (!) 97.4 F (36.3 C)  SpO2: 100%   Filed Weights   03/21/21 0956  Weight: 167 lb 4.8 oz (75.9 kg)    GENERAL:alert, no distress and comfortable SKIN: No rash EYES: sclera clear LUNGS: clear with normal breathing effort HEART: regular rate & rhythm, no lower extremity edema ABDOMEN:abdomen soft, non-tender and normal bowel sounds.  Midline incision completely healed. NEURO: alert & oriented x 3 with fluent  speech, no focal motor/sensory deficits  LABORATORY DATA:  I have reviewed the data as listed CBC Latest Ref Rng & Units 03/21/2021 01/31/2021 12/27/2020  WBC 4.0 - 10.5 K/uL 4.5 4.8 6.3  Hemoglobin 13.0 - 17.0 g/dL 14.2 13.5 13.5  Hematocrit 39.0 - 52.0 % 41.1 39.8 39.4  Platelets 150 - 400 K/uL 143(L) 181 232     CMP Latest Ref Rng & Units 12/27/2020 12/01/2020 11/30/2020  Glucose 70 - 99 mg/dL 84 135(H) 206(H)  BUN 6 - 20 mg/dL _0 Creatinine 0.61 - 1.24 mg/dL 0.94 0.92 0.84  Sodium 135 - 145 mmol/L 143 138 137  Potassium 3.5 - 5.1 mmol/L 4.4 3.8 3.5  Chloride 98 - 111 mmol/L 102 104 103  CO2 22 - 32 mmol/L _1 Calcium 8.9 - 10.3 mg/dL 9.4 8.8(L) 9.0  Total Protein 6.5 - 8.1 g/dL 7.4 5.7(L) -  Total Bilirubin 0.3 - 1.2 mg/dL 0.8 1.0 -  Alkaline Phos 38 - 126 U/L 60 36(L) -  AST 15 - 41 U/L 17 16 -  ALT 0 - 44 U/L 15 12 -      RADIOGRAPHIC STUDIES: I have personally reviewed the radiological images as listed and agreed with the findings in the report. No results found.   ASSESSMENT & PLAN: 35 year old male with no significant PMH or family history   1.  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 Hassell Done. Adjuvant chemo was not recommended.  -He began surveillance and vitamin D.  -ctDNA x2 not detected, will f/up on the level from today to complete testing -continue surveillance plan which includes a colonoscopy at 1 year from surgery, lab and physical exam follow-up every 3 to 4 months in the first few years, then q6-12 months, and CT in 1 year.  He is agreeable to 5-year surveillance plan  2. IDA, secondary to #1 -during hospital work up he was found to be anemic Hgb lowest 10.4 while in the hospital.   -anemia  resolved, he stopped oral iron   3. Genetics -He has no prior personal or family history of cancer -path shows MMR normal and MSI-stable, this is not likely lynch syndrome related CRC -due to his Henry Wall age at diagnosis, he underwent genetic testing which showed no pathogenic mutations, only VUS in ALK and APC genes   Disposition:  [name] is clinically doing well.  Exam is benign, CBC completely normal.  CT DNA x2 not detected, we will follow-up on the outstanding result from today.  Overall there is no clinical concern for colon cancer recurrence.    Continue surveillance.  We reviewed healthy lifestyle and risk reducing behaviors such as abstaining from tobacco and limiting alcohol. We reviewed s/sx of recurrence.   Next routine visit in 3-4 months, anticipate surveillance CT and colonoscopy in 11/2021.  All questions were answered. The patient knows to call the clinic with any problems, questions or concerns. No barriers to learning were detected.     Alla Feeling, NP 03/21/21

## 2021-04-03 DIAGNOSIS — F4323 Adjustment disorder with mixed anxiety and depressed mood: Secondary | ICD-10-CM | POA: Diagnosis not present

## 2021-04-07 ENCOUNTER — Encounter: Payer: Self-pay | Admitting: Nurse Practitioner

## 2021-04-10 LAB — GUARDANT 360

## 2021-05-23 ENCOUNTER — Telehealth: Payer: Self-pay | Admitting: Hematology

## 2021-05-23 NOTE — Telephone Encounter (Signed)
Left message with rescheduled upcoming appointment due to provider's breast clinic. 

## 2021-06-28 ENCOUNTER — Other Ambulatory Visit: Payer: Federal, State, Local not specified - PPO

## 2021-06-28 ENCOUNTER — Ambulatory Visit: Payer: Federal, State, Local not specified - PPO | Admitting: Hematology

## 2021-06-29 ENCOUNTER — Inpatient Hospital Stay: Payer: Federal, State, Local not specified - PPO

## 2021-06-29 ENCOUNTER — Other Ambulatory Visit: Payer: Self-pay

## 2021-06-29 ENCOUNTER — Inpatient Hospital Stay: Payer: Federal, State, Local not specified - PPO | Attending: Hematology | Admitting: Hematology

## 2021-06-29 ENCOUNTER — Encounter: Payer: Self-pay | Admitting: Hematology

## 2021-06-29 ENCOUNTER — Inpatient Hospital Stay: Payer: Federal, State, Local not specified - PPO | Admitting: Hematology

## 2021-06-29 VITALS — BP 125/63 | HR 74 | Temp 98.4°F | Resp 18 | Ht 69.0 in | Wt 161.7 lb

## 2021-06-29 DIAGNOSIS — Z85038 Personal history of other malignant neoplasm of large intestine: Secondary | ICD-10-CM | POA: Diagnosis not present

## 2021-06-29 DIAGNOSIS — C189 Malignant neoplasm of colon, unspecified: Secondary | ICD-10-CM

## 2021-06-29 DIAGNOSIS — D509 Iron deficiency anemia, unspecified: Secondary | ICD-10-CM | POA: Insufficient documentation

## 2021-06-29 LAB — CBC WITH DIFFERENTIAL (CANCER CENTER ONLY)
Abs Immature Granulocytes: 0.01 10*3/uL (ref 0.00–0.07)
Basophils Absolute: 0 10*3/uL (ref 0.0–0.1)
Basophils Relative: 1 %
Eosinophils Absolute: 0.1 10*3/uL (ref 0.0–0.5)
Eosinophils Relative: 2 %
HCT: 41.5 % (ref 39.0–52.0)
Hemoglobin: 14.5 g/dL (ref 13.0–17.0)
Immature Granulocytes: 0 %
Lymphocytes Relative: 40 %
Lymphs Abs: 1.8 10*3/uL (ref 0.7–4.0)
MCH: 32.1 pg (ref 26.0–34.0)
MCHC: 34.9 g/dL (ref 30.0–36.0)
MCV: 91.8 fL (ref 80.0–100.0)
Monocytes Absolute: 0.4 10*3/uL (ref 0.1–1.0)
Monocytes Relative: 9 %
Neutro Abs: 2.1 10*3/uL (ref 1.7–7.7)
Neutrophils Relative %: 48 %
Platelet Count: 168 10*3/uL (ref 150–400)
RBC: 4.52 MIL/uL (ref 4.22–5.81)
RDW: 12.2 % (ref 11.5–15.5)
WBC Count: 4.4 10*3/uL (ref 4.0–10.5)
nRBC: 0 % (ref 0.0–0.2)

## 2021-06-29 LAB — CMP (CANCER CENTER ONLY)
ALT: 11 U/L (ref 0–44)
AST: 17 U/L (ref 15–41)
Albumin: 4.5 g/dL (ref 3.5–5.0)
Alkaline Phosphatase: 57 U/L (ref 38–126)
Anion gap: 10 (ref 5–15)
BUN: 14 mg/dL (ref 6–20)
CO2: 26 mmol/L (ref 22–32)
Calcium: 9.5 mg/dL (ref 8.9–10.3)
Chloride: 106 mmol/L (ref 98–111)
Creatinine: 1.09 mg/dL (ref 0.61–1.24)
GFR, Estimated: >60 mL/min (ref >60)
Glucose, Bld: 58 mg/dL — ABNORMAL LOW (ref 70–99)
Potassium: 4 mmol/L (ref 3.5–5.1)
Sodium: 142 mmol/L (ref 135–145)
Total Bilirubin: 1 mg/dL (ref 0.3–1.2)
Total Protein: 7.1 g/dL (ref 6.5–8.1)

## 2021-06-29 LAB — IRON AND TIBC
Iron: 135 ug/dL (ref 42–163)
Saturation Ratios: 43 % (ref 20–55)
TIBC: 316 ug/dL (ref 202–409)
UIBC: 182 ug/dL (ref 117–376)

## 2021-06-29 LAB — CEA (IN HOUSE-CHCC): CEA (CHCC-In House): 1.2 ng/mL (ref 0.00–5.00)

## 2021-06-29 LAB — FERRITIN: Ferritin: 72 ng/mL (ref 24–336)

## 2021-06-29 NOTE — Progress Notes (Signed)
[place]   Telephone:([phone] Fax:(336) [phone]   Clinic Follow up Note   Patient Care Team: Janith Lima, MD as PCP - General (Internal Medicine) Truitt Merle, MD as Consulting Physician (Oncology) Alla Feeling, NP as Nurse Practitioner (Nurse Practitioner)  Date of Service:  06/29/2021  CHIEF COMPLAINT: f/u of colon cancer  CURRENT THERAPY:  Surveillance  ASSESSMENT & PLAN:  Henry Wall is a 35 y.o. male with   1.  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 Colonoscopy performed on 11/26/20 showed a 4 cm mass in distal sigmoid colon -proceeded to sigmoid colectomy on 11/30/20 by Dr. Hassell Done. Pathology confirmed moderately differentiated adenocarcinoma, 3 cm, lymph nodes and margins negative. -Adjuvant chemo was not recommended, thus he began surveillance  -Circulating tumor DNA GuardianReveal were not detected 5/10 and 03/21/21.  -continue surveillance plan which includes a colonoscopy at 1 year from surgery, lab and physical exam follow-up every 3 to 4 months in the first few years, then q6-12 months, and CT in 1 year.  He is agreeable to 5-year surveillance plan. -lab today reviewed -we will see him in 5 months, with lab and CT CAP a few days before.   2. IDA, secondary to #1 -during hospital work up he was found to be anemic, Hgb lowest 10.4 while in the hospital.   -anemia resolved, he stopped oral iron   3. Genetics -He has no prior personal or family history of cancer -path shows MMR normal and MSI-stable, this is not likely lynch syndrome related CRC -due to his young age at diagnosis, he underwent genetic testing which showed no pathogenic mutations, only VUS in ALK and APC genes    PLAN: -f/u in 5 months, with lab and CT CAP several days before. -will repeat  GuardReveal today    No problem-specific Assessment & Plan notes found for this encounter.   SUMMARY OF ONCOLOGIC HISTORY: Oncology History  Malignant tumor of colon ([place])  11/24/2020 Miscellaneous   FOBT+ in ED - admitted for further work up    11/24/2020 Imaging   CT AP with contrast IMPRESSION: Normal examination.   11/25/2020 Imaging   CTA abdomen/pelvis IMPRESSION: VASCULAR No acute vascular abnormality is noted. No findings to suggest active GI hemorrhage are seen. NON-VASCULAR No acute abnormality noted.  Unchanged from the previous day.   11/26/2020 Procedure   Colonoscopy, Dr. Wilfrid Lund  A fungating and infiltrative partially obstructing mass was found in the distal sigmoid colon, with the distal aspect about 18-20cm from the anal verge when insufflated. The mass was partially circumferential (involving one-half of the lumen circumference), and the adult colonoscope passed easily. The mass measured about four cm in length. Oozing was present due to friable tissue with scope passage. There was also high grade stiagmata of a visible vessel within the mass that was the source of recent brisk bleeding.   11/26/2020 Tumor Marker   Baseline CEA: 0.9   11/30/2020 Definitive Surgery   Lap assisted sigmoid colectomy by Dr. Johnathan Hausen   11/30/2020 Pathology Results   FINAL MICROSCOPIC DIAGNOSIS:  A. COLON, SIGMOID, RESECTION:  -  Adenocarcinoma, moderately differentiated, 3 cm  -  No carcinoma identified in twenty-seven lymph nodes (0/27)  -  Margins uninvolved by carcinoma  Perforation-not identified Lymphovascular invasion-not identified Perineural invasion-not identified PT3PN0 MMR normal, MSI-stable  11/30/2020 Initial Diagnosis   Malignant tumor of colon ([place])   11/30/2020 Cancer Staging   Staging form: Colon and Rectum, AJCC 8th Edition - Pathologic stage from 11/30/2020: Stage IIA (pT3, pN0, cM0) - Signed by Alla Feeling, NP on 12/12/2020 Total positive nodes:  0 Histologic grading system: 4 grade system Histologic grade (G): G2 Laterality: Left Lymph-vascular invasion (LVI): LVI not present (absent)/not identified Perineural invasion (PNI): Absent Microsatellite instability (MSI): Stable   01/23/2021 Genetic Testing   Negative genetic testing:  No pathogenic variants detected on the [employer] CancerNext-Expanded + RNAinsight panel. Two variants of uncertain significance (VUS) were detected - one in the ALK gene called p.R1212H and a second in the APC gene called 5'UTR_EX4dup. The report date is 01/23/2021.  The CancerNext-Expanded + RNAinsight gene panel offered by [employer] and includes sequencing and rearrangement analysis for the following 77 genes: AIP, ALK, APC, ATM, AXIN2, BAP1, BARD1, BLM, BMPR1A, BRCA1, BRCA2, BRIP1, CDC73, CDH1, CDK4, CDKN1B, CDKN2A, CHEK2, CTNNA1, DICER1, FANCC, FH, FLCN, GALNT12, KIF1B, LZTR1, MAX, MEN1, MET, MLH1, MSH2, MSH3, MSH6, MUTYH, NBN, NF1, NF2, NTHL1, PALB2, PHOX2B, PMS2, POT1, PRKAR1A, PTCH1, PTEN, RAD51C, RAD51D, RB1, RECQL, RET, SDHA, SDHAF2, SDHB, SDHC, SDHD, SMAD4, SMARCA4, SMARCB1, SMARCE1, STK11, SUFU, TMEM127, TP53, TSC1, TSC2, VHL and XRCC2 (sequencing and deletion/duplication); EGFR, EGLN1, HOXB13, KIT, MITF, PDGFRA, POLD1 and POLE (sequencing only); EPCAM and GREM1 (deletion/duplication only). RNA data is routinely analyzed for use in variant interpretation for all genes.       INTERVAL HISTORY:  Henry Wall is here for a follow up of colon cancer. He was last seen by NP Lacie on 03/21/21. He presents to the clinic alone. He reports he is doing very well. He has lost some weight, and he reports he is eating better ("less beers").   All other systems were reviewed with the patient and are negative.  MEDICAL HISTORY:  Past Medical History:  Diagnosis Date   Colon cancer ([place]) 11/26/2020   Family history of lung cancer     SURGICAL HISTORY: Past Surgical History:  Procedure Laterality Date    COLONOSCOPY WITH PROPOFOL N/A 11/26/2020   Procedure: COLONOSCOPY WITH PROPOFOL;  Surgeon: Doran Stabler, MD;  Location: [facility];  Service: Gastroenterology;  Laterality: N/A;   COLOSTOMY N/A 11/30/2020   Procedure: sigmoidectomy;  Surgeon: Johnathan Hausen, MD;  Location: [facility];  Service: General;  Laterality: N/A;   SUBMUCOSAL TATTOO INJECTION  11/26/2020   Procedure: SUBMUCOSAL TATTOO INJECTION;  Surgeon: Doran Stabler, MD;  Location: [facility];  Service: Gastroenterology;;   WISDOM TOOTH EXTRACTION      I have reviewed the social history and family history with the patient and they are unchanged from previous note.  ALLERGIES:  has No Known Allergies.  MEDICATIONS:  Current Outpatient Medications  Medication Sig Dispense Refill   Cholecalciferol 1.25 MG (50000 UT) capsule Take 1 capsule (50,000 Units total) by mouth once a week. 12 capsule 1   Melatonin 5 MG CHEW Chew 10 mg by mouth at bedtime.     No current facility-administered medications for this visit.    PHYSICAL EXAMINATION: ECOG PERFORMANCE STATUS: 0 - Asymptomatic  Vitals:   06/29/21 0850  BP: 125/63  Pulse: 74  Resp: 18  Temp: 98.4 F (36.9 C)  SpO2: 98%   Wt Readings from Last 3 Encounters:  06/29/21 161 lb 11.2 oz (73.3 kg)  03/21/21 167 lb 4.8 oz (75.9 kg)  01/09/21 168 lb (76.2 kg)  GENERAL:alert, no distress and comfortable SKIN: skin color, texture, turgor are normal, no rashes or significant lesions EYES: normal, Conjunctiva are pink and non-injected, sclera clear  NECK: supple, thyroid normal size, non-tender, without nodularity LYMPH:  no palpable lymphadenopathy in the cervical, axillary  LUNGS: clear to auscultation and percussion with normal breathing effort HEART: regular rate & rhythm and no murmurs and no lower extremity edema ABDOMEN:abdomen soft, non-tender and normal bowel sounds Musculoskeletal:no cyanosis of digits and no clubbing  NEURO: alert & oriented x 3 with  fluent speech, no focal motor/sensory deficits  LABORATORY DATA:  I have reviewed the data as listed CBC Latest Ref Rng & Units 06/29/2021 03/21/2021 01/31/2021  WBC 4.0 - 10.5 K/uL 4.4 4.5 4.8  Hemoglobin 13.0 - 17.0 g/dL 14.5 14.2 13.5  Hematocrit 39.0 - 52.0 % 41.5 41.1 39.8  Platelets 150 - 400 K/uL 168 143(L) 181     CMP Latest Ref Rng & Units 06/29/2021 12/27/2020 12/01/2020  Glucose 70 - 99 mg/dL 58(L) 84 135(H)  BUN 6 - 20 mg/dL _0 Creatinine 0.61 - 1.24 mg/dL 1.09 0.94 0.92  Sodium 135 - 145 mmol/L 142 143 138  Potassium 3.5 - 5.1 mmol/L 4.0 4.4 3.8  Chloride 98 - 111 mmol/L 106 102 104  CO2 22 - 32 mmol/L _1 Calcium 8.9 - 10.3 mg/dL 9.5 9.4 8.8(L)  Total Protein 6.5 - 8.1 g/dL 7.1 7.4 5.7(L)  Total Bilirubin 0.3 - 1.2 mg/dL 1.0 0.8 1.0  Alkaline Phos 38 - 126 U/L 57 60 36(L)  AST 15 - 41 U/L _2 ALT 0 - 44 U/L _3 RADIOGRAPHIC STUDIES: I have personally reviewed the radiological images as listed and agreed with the findings in the report. No results found.    Orders Placed This Encounter  Procedures   CT CHEST ABDOMEN PELVIS W CONTRAST    Standing Status:   Future    Standing Expiration Date:   06/29/2022    Order Specific Question:   Preferred imaging location?    Answer:   [facility]    Order Specific Question:   Is Oral Contrast requested for this exam?    Answer:   Yes, Per Radiology protocol   All questions were answered. The patient knows to call the clinic with any problems, questions or concerns. No barriers to learning was detected. The total time spent in the appointment was 30 minutes.     Truitt Merle, MD 06/29/2021   I, Wilburn Mylar, am acting as scribe for Truitt Merle, MD.   I have reviewed the above documentation for accuracy and completeness, and I agree with the above.

## 2021-06-30 NOTE — Progress Notes (Signed)
Left voicemail with pt's labs results.  Asked pt to return call so he can further be assessed d/t BG 58 on lab work.

## 2021-07-10 ENCOUNTER — Telehealth: Payer: Self-pay

## 2021-07-10 NOTE — Telephone Encounter (Signed)
This nurse reached out to patient related to negative Guardant reveal results.  Left a message for patient to return call to the facility.  No further concerns at this time.

## 2021-09-21 LAB — GUARDANT 360

## 2021-10-16 ENCOUNTER — Telehealth: Payer: Self-pay | Admitting: Hematology

## 2021-10-16 NOTE — Telephone Encounter (Signed)
Sch per 1/20 inbasket , left msg

## 2021-10-30 ENCOUNTER — Other Ambulatory Visit: Payer: Self-pay

## 2021-10-30 ENCOUNTER — Inpatient Hospital Stay: Payer: Federal, State, Local not specified - PPO

## 2021-10-30 ENCOUNTER — Inpatient Hospital Stay: Payer: Federal, State, Local not specified - PPO | Attending: Hematology

## 2021-10-30 DIAGNOSIS — C189 Malignant neoplasm of colon, unspecified: Secondary | ICD-10-CM

## 2021-10-30 DIAGNOSIS — D509 Iron deficiency anemia, unspecified: Secondary | ICD-10-CM | POA: Insufficient documentation

## 2021-10-30 DIAGNOSIS — Z85038 Personal history of other malignant neoplasm of large intestine: Secondary | ICD-10-CM | POA: Insufficient documentation

## 2021-10-30 LAB — CMP (CANCER CENTER ONLY)
ALT: 17 U/L (ref 0–44)
AST: 21 U/L (ref 15–41)
Albumin: 4.8 g/dL (ref 3.5–5.0)
Alkaline Phosphatase: 52 U/L (ref 38–126)
Anion gap: 8 (ref 5–15)
BUN: 17 mg/dL (ref 6–20)
CO2: 28 mmol/L (ref 22–32)
Calcium: 9.8 mg/dL (ref 8.9–10.3)
Chloride: 103 mmol/L (ref 98–111)
Creatinine: 1.2 mg/dL (ref 0.61–1.24)
GFR, Estimated: >60 mL/min (ref >60)
Glucose, Bld: 123 mg/dL — ABNORMAL HIGH (ref 70–99)
Potassium: 4.2 mmol/L (ref 3.5–5.1)
Sodium: 139 mmol/L (ref 135–145)
Total Bilirubin: 1 mg/dL (ref 0.3–1.2)
Total Protein: 7.2 g/dL (ref 6.5–8.1)

## 2021-10-30 LAB — CBC WITH DIFFERENTIAL (CANCER CENTER ONLY)
Abs Immature Granulocytes: 0.01 10*3/uL (ref 0.00–0.07)
Basophils Absolute: 0 10*3/uL (ref 0.0–0.1)
Basophils Relative: 0 %
Eosinophils Absolute: 0 10*3/uL (ref 0.0–0.5)
Eosinophils Relative: 0 %
HCT: 41.1 % (ref 39.0–52.0)
Hemoglobin: 14.7 g/dL (ref 13.0–17.0)
Immature Granulocytes: 0 %
Lymphocytes Relative: 11 %
Lymphs Abs: 1.1 10*3/uL (ref 0.7–4.0)
MCH: 32.5 pg (ref 26.0–34.0)
MCHC: 35.8 g/dL (ref 30.0–36.0)
MCV: 90.9 fL (ref 80.0–100.0)
Monocytes Absolute: 0.4 10*3/uL (ref 0.1–1.0)
Monocytes Relative: 4 %
Neutro Abs: 7.9 10*3/uL — ABNORMAL HIGH (ref 1.7–7.7)
Neutrophils Relative %: 85 %
Platelet Count: 161 10*3/uL (ref 150–400)
RBC: 4.52 MIL/uL (ref 4.22–5.81)
RDW: 11.9 % (ref 11.5–15.5)
WBC Count: 9.4 10*3/uL (ref 4.0–10.5)
nRBC: 0 % (ref 0.0–0.2)

## 2021-10-30 LAB — IRON AND IRON BINDING CAPACITY (CC-WL,HP ONLY)
Iron: 193 ug/dL — ABNORMAL HIGH (ref 45–182)
Saturation Ratios: 51 % — ABNORMAL HIGH (ref 17.9–39.5)
TIBC: 375 ug/dL (ref 250–450)
UIBC: 182 ug/dL (ref 117–376)

## 2021-10-31 LAB — CEA (IN HOUSE-CHCC): CEA (CHCC-In House): 1.46 ng/mL (ref 0.00–5.00)

## 2021-10-31 LAB — FERRITIN: Ferritin: 80 ng/mL (ref 24–336)

## 2021-11-03 ENCOUNTER — Other Ambulatory Visit: Payer: Self-pay

## 2021-11-13 ENCOUNTER — Telehealth: Payer: Self-pay

## 2021-11-13 NOTE — Telephone Encounter (Signed)
This nurse reached out to patient and made aware of results of Guardant Reveal tests, still showing as not detected.  Patient acknowledged understanding.  No further questions or concerns at this time.

## 2021-11-16 LAB — GUARDANT 360

## 2021-11-27 ENCOUNTER — Telehealth: Payer: Self-pay

## 2021-11-27 NOTE — Telephone Encounter (Signed)
Pt LVM inquiring about having his annual colonoscopy.  Pt asked if someone from Dr. Ernestina Penna office could call him back.  Returned pt's call regarding his annual colonoscopy.  Informed pt that his is scheduled for a f/u appt with Dr. Burr Medico on 12/07/2021 at which time she will most like discuss his CT results and get him scheduled for his annual colonoscopy.  Pt verbalized understanding and had no further questions or concerns at this time.  ?

## 2021-12-04 ENCOUNTER — Inpatient Hospital Stay: Payer: Federal, State, Local not specified - PPO | Attending: Hematology

## 2021-12-04 ENCOUNTER — Ambulatory Visit (HOSPITAL_COMMUNITY)
Admission: RE | Admit: 2021-12-04 | Discharge: 2021-12-04 | Disposition: A | Discharge status: Home / Self Care | Payer: Federal, State, Local not specified - PPO | Source: Ambulatory Visit | Attending: Hematology | Admitting: Hematology

## 2021-12-04 ENCOUNTER — Encounter (HOSPITAL_COMMUNITY): Payer: Self-pay

## 2021-12-04 ENCOUNTER — Other Ambulatory Visit: Payer: Self-pay

## 2021-12-04 DIAGNOSIS — Z9049 Acquired absence of other specified parts of digestive tract: Secondary | ICD-10-CM | POA: Diagnosis not present

## 2021-12-04 DIAGNOSIS — Z85038 Personal history of other malignant neoplasm of large intestine: Secondary | ICD-10-CM | POA: Insufficient documentation

## 2021-12-04 DIAGNOSIS — C189 Malignant neoplasm of colon, unspecified: Secondary | ICD-10-CM | POA: Insufficient documentation

## 2021-12-04 DIAGNOSIS — J984 Other disorders of lung: Secondary | ICD-10-CM | POA: Diagnosis not present

## 2021-12-04 LAB — CBC WITH DIFFERENTIAL (CANCER CENTER ONLY)
Abs Immature Granulocytes: 0 10*3/uL (ref 0.00–0.07)
Basophils Absolute: 0 10*3/uL (ref 0.0–0.1)
Basophils Relative: 1 %
Eosinophils Absolute: 0.2 10*3/uL (ref 0.0–0.5)
Eosinophils Relative: 3 %
HCT: 41.8 % (ref 39.0–52.0)
Hemoglobin: 14.8 g/dL (ref 13.0–17.0)
Immature Granulocytes: 0 %
Lymphocytes Relative: 36 %
Lymphs Abs: 1.7 10*3/uL (ref 0.7–4.0)
MCH: 32.8 pg (ref 26.0–34.0)
MCHC: 35.4 g/dL (ref 30.0–36.0)
MCV: 92.7 fL (ref 80.0–100.0)
Monocytes Absolute: 0.5 10*3/uL (ref 0.1–1.0)
Monocytes Relative: 10 %
Neutro Abs: 2.3 10*3/uL (ref 1.7–7.7)
Neutrophils Relative %: 50 %
Platelet Count: 149 10*3/uL — ABNORMAL LOW (ref 150–400)
RBC: 4.51 MIL/uL (ref 4.22–5.81)
RDW: 12.1 % (ref 11.5–15.5)
WBC Count: 4.6 10*3/uL (ref 4.0–10.5)
nRBC: 0 % (ref 0.0–0.2)

## 2021-12-04 MED ORDER — SODIUM CHLORIDE (PF) 0.9 % IJ SOLN
INTRAMUSCULAR | Status: AC
  Filled 2021-12-04: qty 50

## 2021-12-04 MED ORDER — IOHEXOL 300 MG/ML  SOLN
100.0000 mL | Freq: Once | INTRAMUSCULAR | Status: AC | PRN
  Administered 2021-12-04: 100 mL via INTRAVENOUS

## 2021-12-07 ENCOUNTER — Encounter: Payer: Self-pay | Admitting: Hematology

## 2021-12-07 ENCOUNTER — Inpatient Hospital Stay (HOSPITAL_BASED_OUTPATIENT_CLINIC_OR_DEPARTMENT_OTHER): Payer: Federal, State, Local not specified - PPO | Admitting: Hematology

## 2021-12-07 ENCOUNTER — Other Ambulatory Visit: Payer: Self-pay

## 2021-12-07 VITALS — BP 123/64 | HR 71 | Temp 97.9°F | Resp 18 | Ht 69.0 in | Wt 170.2 lb

## 2021-12-07 DIAGNOSIS — C187 Malignant neoplasm of sigmoid colon: Secondary | ICD-10-CM

## 2021-12-07 DIAGNOSIS — Z85038 Personal history of other malignant neoplasm of large intestine: Secondary | ICD-10-CM | POA: Diagnosis not present

## 2021-12-07 NOTE — Progress Notes (Signed)
?Henry Wall   ?Telephone:(336) [phone] [phone]   ?Clinic Follow up Note  ? ?Patient Care Team: ?Janith Lima, MD as PCP - General (Internal Medicine) ?Truitt Merle, MD as Consulting Physician (Oncology) ?Alla Feeling, NP as Nurse Practitioner (Nurse Practitioner) ? ?Date of Service:  12/07/2021 ? ?CHIEF COMPLAINT: f/u of colon cancer ? ?CURRENT THERAPY:  ?Surveillance ? ?ASSESSMENT & PLAN:  ?Henry Wall is a 36 y.o. male with  ? ?1.  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 Colonoscopy performed on 11/26/20 showed a 4 cm mass in distal sigmoid colon ?-sigmoid colectomy on 11/30/20 by Dr. Hassell Done confirmed: moderately differentiated adenocarcinoma, 3 cm; lymph nodes and margins negative. ?-Adjuvant chemo was not recommended, thus he began surveillance  ?-Circulating tumor DNA GuardiantReveal were not detected 01/31/21, 03/21/21, and 10/30/21. ?-restaging CT CAP on 12/04/21 showed NED. I reviewed the results with them today. ?-continue surveillance, he is due for colonoscopy. I will reach out to Dr. Loletha Carrow to get him in for surveillance colonoscopy. ?-CBC from 12/04/21 reviewed, overall WNL.  He is clinically doing well, no concern for recurrence. ?-We again reviewed things to watch at home, and cancer prevention in general, including healthy diet, exercise, and vitamin D supplement. ?-we will see him in 4 months ?  ?2. Genetics ?-He has no prior personal or family history of cancer ?-path shows MMR normal and MSI-stable, this is not likely lynch syndrome related CRC ?-due to his young age at diagnosis, he underwent genetic testing which showed no pathogenic mutations, only VUS in ALK and APC genes  ?-he notes his siblings are aware of his diagnosis. They do not have children. ?  ?  ?PLAN: ?-he is due for colonoscopy. We  will reach out to Dr. Loletha Carrow. ?-lab and f/u with NP Lacie in 4 months ? ? ?No problem-specific Assessment & Plan notes found for this encounter. ? ? ?SUMMARY OF ONCOLOGIC HISTORY: ?Oncology History Overview Note  ? Cancer Staging  ?Cancer of sigmoid colon ([place]) ?Staging form: Colon and Rectum, AJCC 8th Edition ?- Pathologic stage from 11/30/2020: Stage IIA (pT3, pN0, cM0) - Signed by Alla Feeling, NP on 12/12/2020 ? ?  ?Cancer of sigmoid colon ([place])  ?11/24/2020 Miscellaneous  ? FOBT+ in ED - admitted for further work up  ?  ?11/24/2020 Imaging  ? CT AP with contrast IMPRESSION: ?Normal examination. ?  ?11/25/2020 Imaging  ? CTA abdomen/pelvis IMPRESSION: ?VASCULAR ?No acute vascular abnormality is noted. No findings to suggest ?active GI hemorrhage are seen. ?NON-VASCULAR ?No acute abnormality noted.  Unchanged from the previous day. ?  ?11/26/2020 Procedure  ? Colonoscopy, Dr. Wilfrid Lund  ?A fungating and infiltrative partially obstructing mass was found in the distal sigmoid colon, with the distal aspect about 18-20cm from the anal verge when insufflated. The mass was partially circumferential (involving one-half of the lumen circumference), and the adult colonoscope passed easily. The mass ?measured about four cm in length. Oozing was present due to friable tissue with scope passage. There was also high grade stiagmata of a visible vessel within the mass that was the source of recent brisk bleeding. ?  ?11/26/2020 Tumor Marker  ? Baseline CEA: 0.9 ?  ?11/30/2020 Definitive Surgery  ? Lap assisted sigmoid colectomy by Dr. Johnathan Hausen ?  ?11/30/2020 Pathology Results  ? FINAL MICROSCOPIC DIAGNOSIS:  ?A.  COLON, SIGMOID, RESECTION:  ?-  Adenocarcinoma, moderately differentiated, 3 cm  ?-  No carcinoma identified in twenty-seven lymph nodes (0/27)  ?-  Margins uninvolved by carcinoma  ?Perforation-not identified ?Lymphovascular invasion-not identified ?Perineural invasion-not identified ?PT3PN0 ?MMR normal, MSI-stable ?   ?11/30/2020 Initial Diagnosis  ? Malignant tumor of colon [facility]) ?  ?11/30/2020 Cancer Staging  ? Staging form: Colon and Rectum, AJCC 8th Edition ?- Pathologic stage from 11/30/2020: Stage IIA (pT3, pN0, cM0) - Signed by Alla Feeling, NP on 12/12/2020 ?Total positive nodes: 0 ?Histologic grading system: 4 grade system ?Histologic grade (G): G2 ?Laterality: Left ?Lymph-vascular invasion (LVI): LVI not present (absent)/not identified ?Perineural invasion (PNI): Absent ?Microsatellite instability (MSI): Stable ? ?  ?01/23/2021 Genetic Testing  ? Negative genetic testing:  No pathogenic variants detected on the [employer] CancerNext-Expanded + RNAinsight panel. Two variants of uncertain significance (VUS) were detected - one in the ALK gene called p.R1212H and a second in the APC gene called 5'UTR_EX4dup. The report date is 01/23/2021. ? ?The CancerNext-Expanded + RNAinsight gene panel offered by [employer] and includes sequencing and rearrangement analysis for the following 77 genes: AIP, ALK, APC, ATM, AXIN2, BAP1, BARD1, BLM, BMPR1A, BRCA1, BRCA2, BRIP1, CDC73, CDH1, CDK4, CDKN1B, CDKN2A, CHEK2, CTNNA1, DICER1, FANCC, FH, FLCN, GALNT12, KIF1B, LZTR1, MAX, MEN1, MET, MLH1, MSH2, MSH3, MSH6, MUTYH, NBN, NF1, NF2, NTHL1, PALB2, PHOX2B, PMS2, POT1, PRKAR1A, PTCH1, PTEN, RAD51C, RAD51D, RB1, RECQL, RET, SDHA, SDHAF2, SDHB, SDHC, SDHD, SMAD4, SMARCA4, SMARCB1, SMARCE1, STK11, SUFU, TMEM127, TP53, TSC1, TSC2, VHL and XRCC2 (sequencing and deletion/duplication); EGFR, EGLN1, HOXB13, KIT, MITF, PDGFRA, POLD1 and POLE (sequencing only); EPCAM and GREM1 (deletion/duplication only). RNA data is routinely analyzed for use in variant interpretation for all genes.  ?  ?12/04/2021 Imaging  ? EXAM: ?CT CHEST, ABDOMEN, AND PELVIS WITH CONTRAST ? ?IMPRESSION: ?1. No evidence of local recurrence or metastatic disease within the ?chest, abdomen, or pelvis. ?2. Large colonic stool burden. ?  ? ? ? ?INTERVAL HISTORY:  ?Henry Wall is here for a  follow up of colon cancer. He was last seen by me on 06/29/21. He presents to the clinic accompanied by his wife. ?He reports he is doing well overall, no new complaints or concerns. He also denies leg swelling or mouth sores. ?  ?All other systems were reviewed with the patient and are negative. ? ?MEDICAL HISTORY:  ?Past Medical History:  ?Diagnosis Date  ? Colon cancer ([place]) 11/26/2020  ? Family history of lung cancer   ? ? ?SURGICAL HISTORY: ?Past Surgical History:  ?Procedure Laterality Date  ? COLONOSCOPY WITH PROPOFOL N/A 11/26/2020  ? Procedure: COLONOSCOPY WITH PROPOFOL;  Surgeon: Doran Stabler, MD;  Location: [facility];  Service: Gastroenterology;  Laterality: N/A;  ? COLOSTOMY N/A 11/30/2020  ? Procedure: sigmoidectomy;  Surgeon: Johnathan Hausen, MD;  Location: [facility];  Service: General;  Laterality: N/A;  ? SUBMUCOSAL TATTOO INJECTION  11/26/2020  ? Procedure: SUBMUCOSAL TATTOO INJECTION;  Surgeon: Doran Stabler, MD;  Location: [facility];  Service: Gastroenterology;;  ? WISDOM TOOTH EXTRACTION    ? ? ?I have reviewed the social history and family history with the patient and they are unchanged from previous note. ? ?ALLERGIES:  has No Known Allergies. ? ?MEDICATIONS:  ?Current Outpatient Medications  ?Medication Sig Dispense Refill  ? Cholecalciferol 1.25 MG (50000 UT) capsule Take 1 capsule (50,000 Units total) by mouth once a week. 12 capsule 1  ? Melatonin 5 MG CHEW Chew 10 mg by mouth  at bedtime.    ? ?No current facility-administered medications for this visit.  ? ? ?PHYSICAL EXAMINATION: ?ECOG PERFORMANCE STATUS: 0 - Asymptomatic ? ?Vitals:  ? 12/07/21 1056  ?BP: 123/64  ?Pulse: 71  ?Resp: 18  ?Temp: 97.9 ?F (36.6 ?C)  ?SpO2: 100%  ? ?Wt Readings from Last 3 Encounters:  ?12/07/21 170 lb 3.2 oz (77.2 kg)  ?06/29/21 161 lb 11.2 oz (73.3 kg)  ?03/21/21 167 lb 4.8 oz (75.9 kg)  ?  ? ?GENERAL:alert, no distress and comfortable ?SKIN: skin color normal, no rashes or significant lesions ?EYES:  normal, Conjunctiva are pink and non-injected, sclera clear  ?NEURO: alert & oriented x 3 with fluent speech ? ?LABORATORY DATA:  ?I have reviewed the data as listed ?CBC Latest Ref Rng & Units 12/04/2021 10/30/2021

## 2021-12-21 NOTE — Telephone Encounter (Signed)
Ok to schedule pt for direct colon at [facility] or would you like to see pt in the office? Please advise, thanks. ?

## 2021-12-21 NOTE — Telephone Encounter (Signed)
Patient called to schedule hospital procedure. ?

## 2021-12-23 NOTE — Telephone Encounter (Signed)
Thank you for the note. ? ?I suspect that his recall sheet with my notes on it have not come across your desk in order to answer this question. ? ?His colonoscopy was done at [facility] because he was admitted to the hospital at that time for GI bleeding. ? ?He is able to have his upcoming colonoscopy with me in the [facility] ? ?- HD ?

## 2021-12-25 NOTE — Telephone Encounter (Addendum)
Henry Wall, please see message from Dr. Loletha Carrow below. Pt can be scheduled for recall colonoscopy in the [place]. Thanks ?

## 2022-01-03 ENCOUNTER — Encounter: Payer: Self-pay | Admitting: Gastroenterology

## 2022-01-03 ENCOUNTER — Telehealth: Payer: Self-pay | Admitting: Gastroenterology

## 2022-01-03 NOTE — Telephone Encounter (Signed)
Patient called to schedule colonoscopy. Patient has 1 year recall in hospital. Please advise.  ?

## 2022-01-30 ENCOUNTER — Ambulatory Visit (AMBULATORY_SURGERY_CENTER): Payer: Federal, State, Local not specified - PPO

## 2022-01-30 VITALS — Ht 69.0 in | Wt 160.0 lb

## 2022-01-30 DIAGNOSIS — Z800 Family history of malignant neoplasm of digestive organs: Secondary | ICD-10-CM

## 2022-01-30 MED ORDER — PEG 3350-KCL-NA BICARB-NACL 420 G PO SOLR: 4000.0000 mL | Freq: Once | ORAL | 0 refills | Status: AC

## 2022-01-30 NOTE — Progress Notes (Signed)
No egg or soy allergy known to patient  ?No issues known to pt with past sedation with any surgeries or procedures ?Patient denies ever being told they had issues or difficulty with intubation  ?No FH of Malignant Hyperthermia ?Pt is not on diet pills ?Pt is not on home 02  ?Pt is not on blood thinners  ?Pt denies issues with constipation  ?No A fib or A flutter ?NO PA's for preps discussed with pt in PV today  ?Discussed with pt there will be an out-of-pocket cost for prep and that varies from $0 to 70 + dollars - pt verbalized understanding  ?Pt instructed to use Singlecare.com or GoodRx for a price reduction on prep  ?PV completed over the phone. Pt verified name, DOB, address and insurance during PV today.  ?Pt mailed instruction packet with copy of consent form to read and not return, and instructions.  ?Pt encouraged to call with questions or issues.  ?Pt has My chart, procedure instructions sent via My Chart  ?Insurance confirmed with pt at [facility] today  ? ?

## 2022-02-20 ENCOUNTER — Encounter: Payer: Self-pay | Admitting: Gastroenterology

## 2022-02-20 ENCOUNTER — Ambulatory Visit (AMBULATORY_SURGERY_CENTER): Payer: Federal, State, Local not specified - PPO | Admitting: Gastroenterology

## 2022-02-20 VITALS — BP 106/47 | HR 66 | Temp 98.0°F | Resp 18 | Ht 69.0 in | Wt 160.0 lb

## 2022-02-20 DIAGNOSIS — Z09 Encounter for follow-up examination after completed treatment for conditions other than malignant neoplasm: Secondary | ICD-10-CM

## 2022-02-20 DIAGNOSIS — Z1211 Encounter for screening for malignant neoplasm of colon: Secondary | ICD-10-CM | POA: Diagnosis not present

## 2022-02-20 DIAGNOSIS — K626 Ulcer of anus and rectum: Secondary | ICD-10-CM | POA: Diagnosis not present

## 2022-02-20 DIAGNOSIS — K9189 Other postprocedural complications and disorders of digestive system: Secondary | ICD-10-CM | POA: Diagnosis not present

## 2022-02-20 DIAGNOSIS — D122 Benign neoplasm of ascending colon: Secondary | ICD-10-CM

## 2022-02-20 DIAGNOSIS — Z85038 Personal history of other malignant neoplasm of large intestine: Secondary | ICD-10-CM

## 2022-02-20 DIAGNOSIS — D124 Benign neoplasm of descending colon: Secondary | ICD-10-CM | POA: Diagnosis not present

## 2022-02-20 DIAGNOSIS — D123 Benign neoplasm of transverse colon: Secondary | ICD-10-CM

## 2022-02-20 MED ORDER — SODIUM CHLORIDE 0.9 % IV SOLN: 500.0000 mL | Freq: Once | INTRAVENOUS | Status: DC

## 2022-02-20 NOTE — Patient Instructions (Signed)
Handout provided on polyps.   YOU HAD AN ENDOSCOPIC PROCEDURE TODAY AT THE [facility]:   Refer to the procedure report that was given to you for any specific questions about what was found during the examination.  If the procedure report does not answer your questions, please call your gastroenterologist to clarify.  If you requested that your care partner not be given the details of your procedure findings, then the procedure report has been included in a sealed envelope for you to review at your convenience later.  YOU SHOULD EXPECT: Some feelings of bloating in the abdomen. Passage of more gas than usual.  Walking can help get rid of the air that was put into your GI tract during the procedure and reduce the bloating. If you had a lower endoscopy (such as a colonoscopy or flexible sigmoidoscopy) you may notice spotting of blood in your stool or on the toilet paper. If you underwent a bowel prep for your procedure, you may not have a normal bowel movement for a few days.  Please Note:  You might notice some irritation and congestion in your nose or some drainage.  This is from the oxygen used during your procedure.  There is no need for concern and it should clear up in a day or so.  SYMPTOMS TO REPORT IMMEDIATELY:  Following lower endoscopy (colonoscopy or flexible sigmoidoscopy):  Excessive amounts of blood in the stool  Significant tenderness or worsening of abdominal pains  Swelling of the abdomen that is new, acute  Fever of 100F or higher  For urgent or emergent issues, a gastroenterologist can be reached at any hour by calling [phone]. Do not use MyChart messaging for urgent concerns.    DIET:  We do recommend a small meal at first, but then you may proceed to your regular diet.  Drink plenty of fluids but you should avoid alcoholic beverages for 24 hours.  ACTIVITY:  You should plan to take it easy for the rest of today and you should NOT DRIVE or use heavy  machinery until tomorrow (because of the sedation medicines used during the test).    FOLLOW UP: Our staff will call the number listed on your records 48-72 hours following your procedure to check on you and address any questions or concerns that you may have regarding the information given to you following your procedure. If we do not reach you, we will leave a message.  We will attempt to reach you two times.  During this call, we will ask if you have developed any symptoms of COVID 19. If you develop any symptoms (ie: fever, flu-like symptoms, shortness of breath, cough etc.) before then, please call [phone].  If you test positive for Covid 19 in the 2 weeks post procedure, please call and report this information to us.    If any biopsies were taken you will be contacted by phone or by letter within the next 1-3 weeks.  Please call us at [phone] if you have not heard about the biopsies in 3 weeks.    SIGNATURES/CONFIDENTIALITY: You and/or your care partner have signed paperwork which will be entered into your electronic medical record.  These signatures attest to the fact that that the information above on your After Visit Summary has been reviewed and is understood.  Full responsibility of the confidentiality of this discharge information lies with you and/or your care-partner.  

## 2022-02-20 NOTE — Progress Notes (Signed)
To pacu, VSS. Report to [email or web address] 

## 2022-02-20 NOTE — Progress Notes (Signed)
Pt's states no medical or surgical changes since previsit or office visit. 

## 2022-02-20 NOTE — Op Note (Signed)
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 The patient's tolerance of                            previous anesthesia was also reviewed. The risks                            and benefits of the procedure and the sedation                            options and risks were discussed with the patient.                            All questions were answered, and informed consent                            was obtained. Prior Anticoagulants: The patient has                            taken no previous anticoagulant or antiplatelet                            agents. ASA Grade  Assessment: II - A patient with                            mild systemic disease. After reviewing the risks                            and benefits, the patient was deemed in                            satisfactory condition to undergo the procedure.                           After obtaining informed consent, the colonoscope                            was passed under direct vision. Throughout the                            procedure, the patient's blood pressure,  pulse, and                            oxygen saturations were monitored continuously. The                            CF [id number] #[id number] was introduced through the anus                            and advanced to the the cecum, identified by                            appendiceal orifice and ileocecal valve. The                            colonoscopy was performed without difficulty. The                            patient tolerated the procedure well. The quality                            of the bowel preparation was good after lavage                            (initially retained opaque liquid in some areas).                            The ileocecal valve, appendiceal orifice, and                            rectum were photographed. The bowel preparation                            used was GoLYTELY. Scope In: 1:34:24 PM Scope Out: 1:53:39 PM Scope Withdrawal Time: 0 hours 16 minutes 56 seconds  Total Procedure Duration: 0 hours 19  minutes 15 seconds  Findings:                 The perianal and digital rectal examinations were                            normal.                           Four sessile polyps were found in the descending                            colon, transverse colon and ascending colon. The                            polyps were 3 to 5 mm in size. These polyps were                            removed with a cold snare. Resection and retrieval                            were complete.                           There was evidence of a prior end-to-end                            colo-rectal anastomosis in the proximal rectum.                            This was patent and was characterized by focal                            inflammation around a visible suture. Biopsies were                            taken with a cold forceps for histology.                           Repeat examination of right colon under NBI                            performed.                            The exam was otherwise without abnormality on                            direct and retroflexion views. Complications:            No immediate complications. Estimated Blood Loss:     Estimated blood loss was minimal. Impression:               - Four 3 to 5 mm polyps in the descending colon, in  the transverse colon and in the ascending colon,                            removed with a cold snare. Resected and retrieved.                           - Patent end-to-end colo-rectal anastomosis,                            characterized by inflammation and visible sutures.                            Biopsied. (looks like benign low grade inflammatory                            reaction to suture rather than neoplasia)                           - The examination was otherwise normal on direct                            and retroflexion views. Recommendation:           - Patient has a contact number available for                            emergencies. The signs and symptoms of potential                            delayed complications were discussed with the                            patient. Return to normal activities tomorrow.                            Written discharge instructions were provided to the                            patient.                           - Resume previous diet.                           - Continue present medications.                           - Await pathology results.                           - Repeat colonoscopy is recommended for                            surveillance. The colonoscopy date will be  determined after pathology results from today's                            exam become available for review. Henry Vastine L. Loletha Carrow, MD 02/20/2022 2:05:03 PM This report has been signed electronically.

## 2022-02-20 NOTE — Progress Notes (Signed)
Called to room to assist during endoscopic procedure.  Patient ID and intended procedure confirmed with present staff. Received instructions for my participation in the procedure from the performing physician.  

## 2022-02-20 NOTE — Progress Notes (Signed)
History and Physical:  This patient presents for endoscopic testing for: Encounter Diagnosis  Name Primary?   Personal history of colon cancer Yes    This patient presents today for surveillance colonoscopy.  He was diagnosed with left-sided colon cancer in March 2022 and underwent sigmoid resection. Patient denies chronic abdominal pain, rectal bleeding, constipation or diarrhea.  Patient is otherwise without complaints or active issues today.   Past Medical History: Past Medical History:  Diagnosis Date   Colon cancer ([place]) 11/26/2020   Family history of lung cancer      Past Surgical History: Past Surgical History:  Procedure Laterality Date   COLONOSCOPY WITH PROPOFOL N/A 11/26/2020   Procedure: COLONOSCOPY WITH PROPOFOL;  Surgeon: Doran Stabler, MD;  Location: [facility];  Service: Gastroenterology;  Laterality: N/A;   COLOSTOMY N/A 11/30/2020   Procedure: sigmoidectomy;  Surgeon: Johnathan Hausen, MD;  Location: [facility];  Service: General;  Laterality: N/A;   SUBMUCOSAL TATTOO INJECTION  11/26/2020   Procedure: SUBMUCOSAL TATTOO INJECTION;  Surgeon: Doran Stabler, MD;  Location: [facility];  Service: Gastroenterology;;   WISDOM TOOTH EXTRACTION      Allergies: No Known Allergies  Outpatient Meds: Current Outpatient Medications  Medication Sig Dispense Refill   Cholecalciferol 1.25 MG (50000 UT) capsule Take 1 capsule (50,000 Units total) by mouth once a week. 12 capsule 1   Melatonin 5 MG CHEW Chew 10 mg by mouth at bedtime.     Current Facility-Administered Medications  Medication Dose Route Frequency Provider Last Rate Last Admin   0.9 %  sodium chloride infusion  500 mL Intravenous Once Nelida Meuse III, MD          ___________________________________________________________________ Objective   Exam:  BP (!) 141/79   Pulse 71   Temp 98 F (36.7 C)   Ht '5\' 9"'$  (1.753 m)   Wt 160 lb (72.6 kg)   SpO2 100%   BMI 23.63 kg/m  Seen in  pre-procedure area CV: RRR without murmur, S1/S2 Resp: clear to auscultation bilaterally, normal RR and effort noted GI: soft, no tenderness, with active bowel sounds.   Assessment: Encounter Diagnosis  Name Primary?   Personal history of colon cancer Yes     Plan: Colonoscopy  The benefits and risks of the planned procedure were described in detail with the patient or (when appropriate) their health care proxy.  Risks were outlined as including, but not limited to, bleeding, infection, perforation, adverse medication reaction leading to cardiac or pulmonary decompensation, pancreatitis (if ERCP).  The limitation of incomplete mucosal visualization was also discussed.  No guarantees or warranties were given.    The patient is appropriate for an endoscopic procedure in the ambulatory setting.   - Wilfrid Lund, MD

## 2022-02-21 ENCOUNTER — Telehealth: Payer: Self-pay

## 2022-02-21 NOTE — Telephone Encounter (Signed)
  Follow up Call-     02/20/2022    1:00 PM  Call back number  Post procedure Call Back phone  # [phone]  Permission to leave phone message Yes     Patient questions:  Do you have a fever, pain , or abdominal swelling? No. Pain Score  0 *  Have you tolerated food without any problems? Yes.    Have you been able to return to your normal activities? Yes.    Do you have any questions about your discharge instructions: Diet   No. Medications  No. Follow up visit  No.  Do you have questions or concerns about your Care? No.  Actions: * If pain score is 4 or above: No action needed, pain <4.

## 2022-02-23 ENCOUNTER — Encounter: Payer: Self-pay | Admitting: Gastroenterology

## 2022-03-31 IMAGING — CT CT CTA ABD/PEL W/CM AND/OR W/O CM
2 of 19 series · 12 of 47 positions shown, 17 images · IV contrast (OMNIPAQUE 350)
Comparison: CT from the previous day.

CLINICAL DATA: Recent GI bleeding, history of hemorrhoids

EXAM:
CTA ABDOMEN AND PELVIS WITHOUT AND WITH CONTRAST
TECHNIQUE: Multidetector CT imaging of the abdomen and pelvis was performed
using the standard protocol during bolus administration of
intravenous contrast. Multiplanar reconstructed images and MIPs were
obtained and reviewed to evaluate the vascular anatomy.
CONTRAST:  100mL OMNIPAQUE IOHEXOL 350 MG/ML SOLN

[Series 7: axial arterial · axial · arterial · 0.84mm/px · z∈[+1184,+1528]mm · 8 of 208 slices shown]
[im 18/208  soft-tissue]
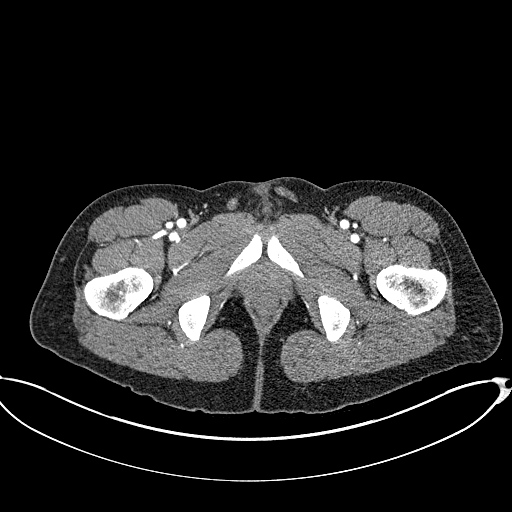
[im 52/208  soft-tissue]
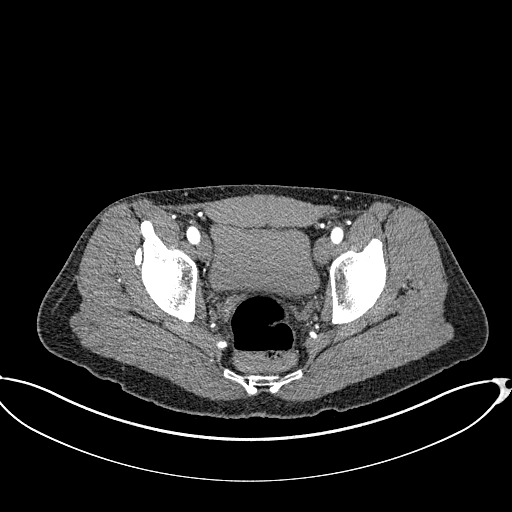
[im 70/208  soft-tissue]
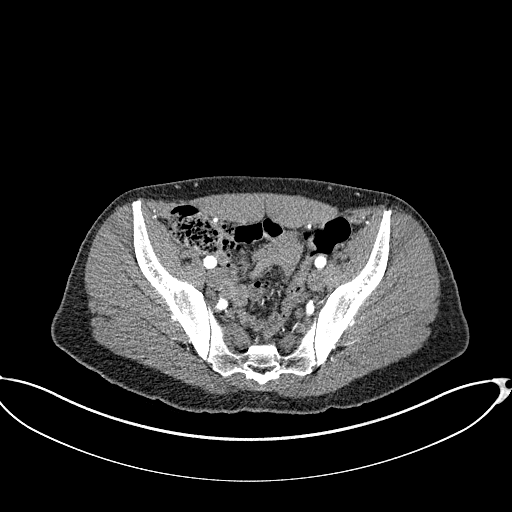
[im 87/208  soft-tissue]
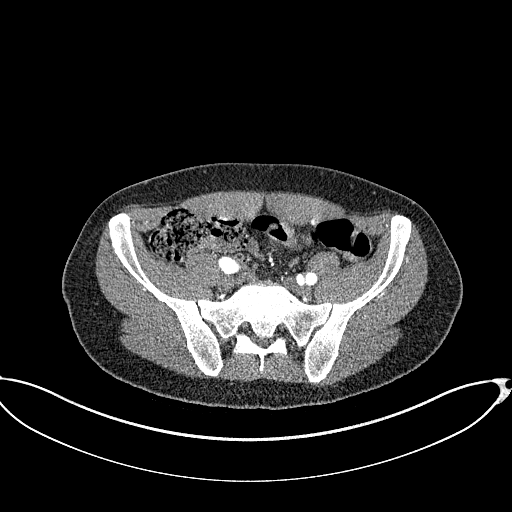
[im 121/208  soft-tissue]
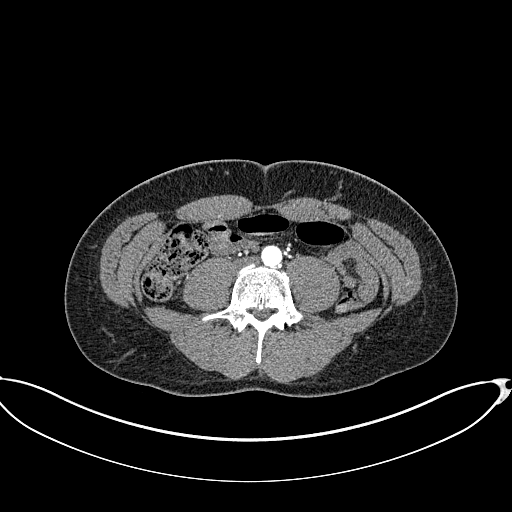
[im 139/208  soft-tissue]
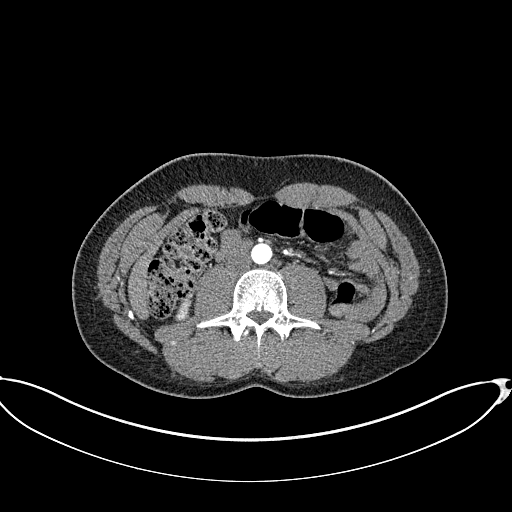
[im 156/208  soft-tissue]
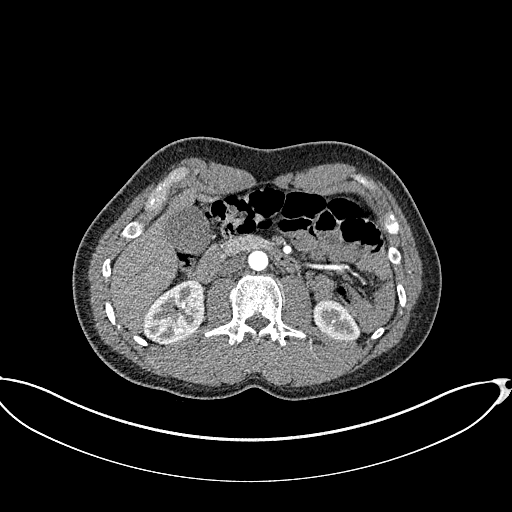
[im 190/208  soft-tissue]
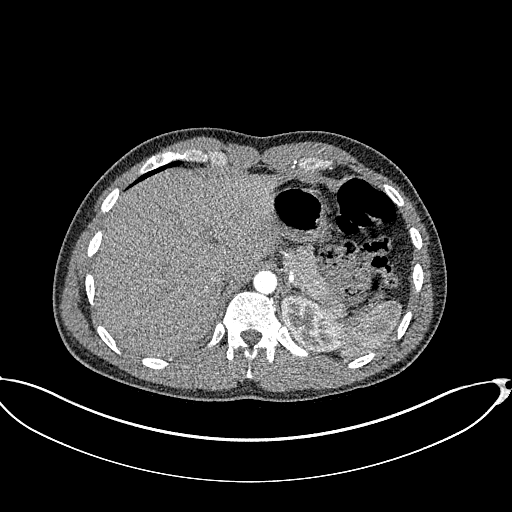

[Series 14: axial portal venous · axial · portal-venous · 0.84mm/px · z∈[+1229,+1479]mm · 4 of 84 slices shown, 9 images]
[im 17/84  soft-tissue]
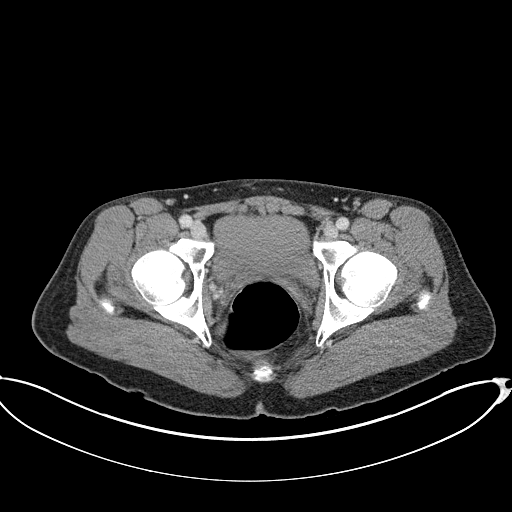
[im 17/84  lung]
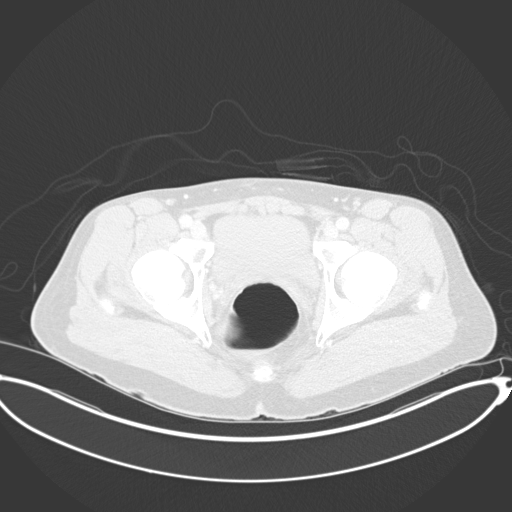
[im 17/84  bone]
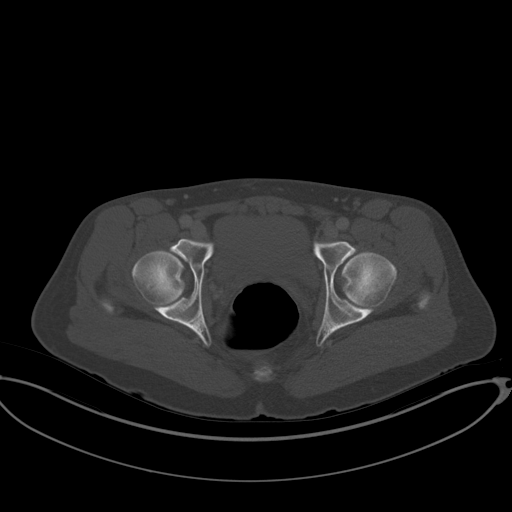
[im 34/84  soft-tissue]
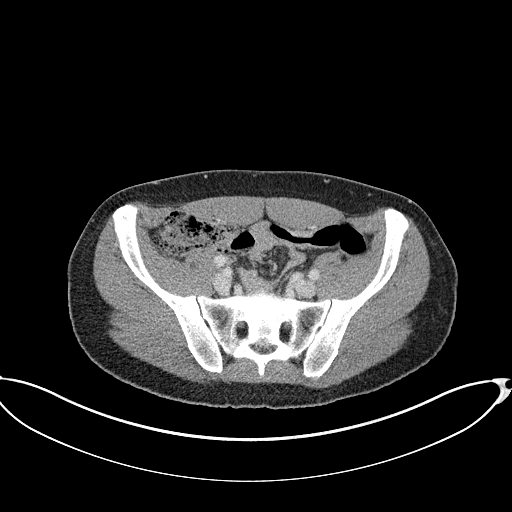
[im 34/84  lung]
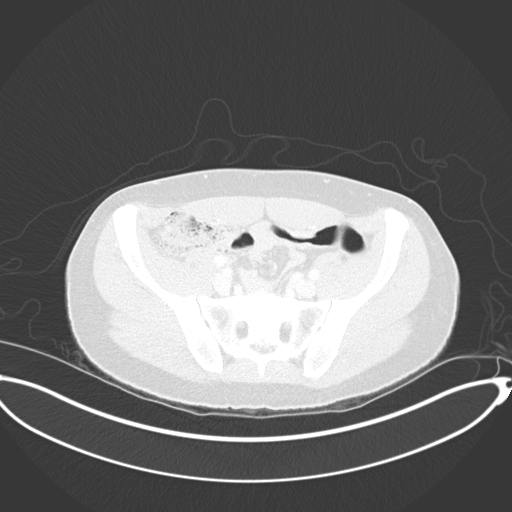
[im 50/84  soft-tissue]
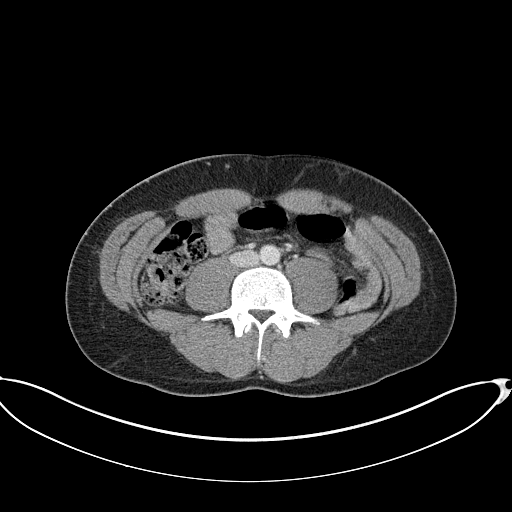
[im 50/84  lung]
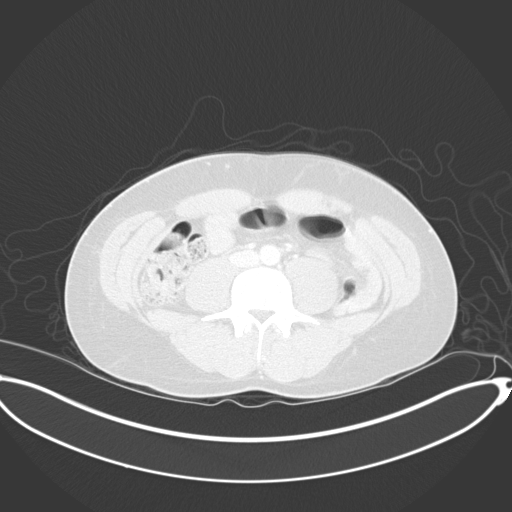
[im 67/84  soft-tissue]
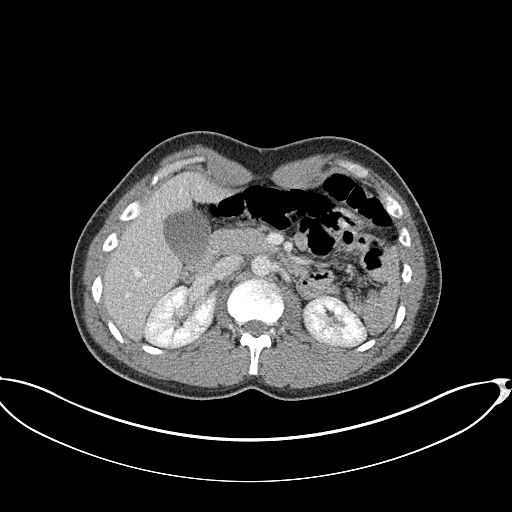
[im 67/84  lung]
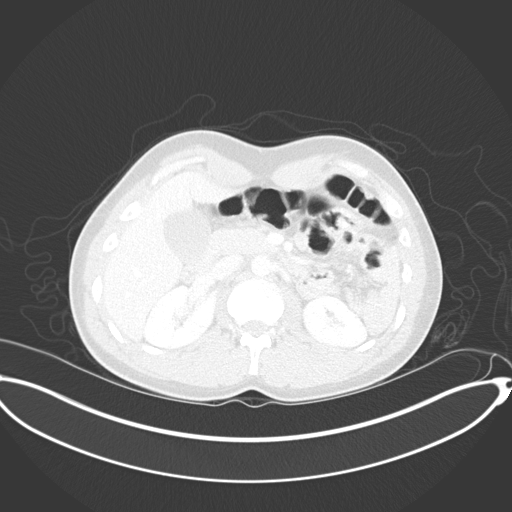

[12 of 47 positions shown; findings below may reference images not displayed]

FINDINGS: VASCULAR

Aorta: Normal caliber aorta without aneurysm, dissection, vasculitis
or significant stenosis.

Celiac: Patent without evidence of aneurysm, dissection, vasculitis
or significant stenosis.

SMA: Patent without evidence of aneurysm, dissection, vasculitis or
significant stenosis.

Renals: Both renal arteries are patent without evidence of aneurysm,
dissection, vasculitis, fibromuscular dysplasia or significant
stenosis.

IMA: Patent without evidence of aneurysm, dissection, vasculitis or
significant stenosis.

Iliacs: Patent without evidence of aneurysm, dissection, vasculitis
or significant stenosis.

Veins: No specific venous abnormality is noted.

Review of the MIP images confirms the above findings.

NON-VASCULAR

Lower chest: No acute abnormality.

Hepatobiliary: No focal liver abnormality is seen. No gallstones,
gallbladder wall thickening, or biliary dilatation.

Pancreas: Unremarkable. No pancreatic ductal dilatation or
surrounding inflammatory changes.

Spleen: Normal in size without focal abnormality.

Adrenals/Urinary Tract: Adrenal glands are unremarkable. Kidneys are
normal, without renal calculi, focal lesion, or hydronephrosis.
Bladder is unremarkable.

Stomach/Bowel: No obstructive or inflammatory changes are noted
within the colon. No areas of contrast extravasation are identified
to suggest acute hemorrhage. Small bowel and stomach are within
normal limits.

Lymphatic: No enlarged abdominal or pelvic lymph nodes.

Reproductive: Prostate is unremarkable.

Other: No abdominal wall hernia or abnormality. No abdominopelvic
ascites.

Musculoskeletal: No acute or significant osseous findings.
IMPRESSION: VASCULAR

No acute vascular abnormality is noted. No findings to suggest
active GI hemorrhage are seen.

NON-VASCULAR

No acute abnormality noted.  Unchanged from the previous day.

## 2022-04-06 ENCOUNTER — Other Ambulatory Visit: Payer: Self-pay

## 2022-04-06 DIAGNOSIS — C187 Malignant neoplasm of sigmoid colon: Secondary | ICD-10-CM

## 2022-04-06 DIAGNOSIS — C189 Malignant neoplasm of colon, unspecified: Secondary | ICD-10-CM

## 2022-04-09 ENCOUNTER — Other Ambulatory Visit: Payer: Self-pay

## 2022-04-09 ENCOUNTER — Inpatient Hospital Stay (HOSPITAL_BASED_OUTPATIENT_CLINIC_OR_DEPARTMENT_OTHER): Payer: Federal, State, Local not specified - PPO | Admitting: Hematology

## 2022-04-09 ENCOUNTER — Inpatient Hospital Stay: Payer: Federal, State, Local not specified - PPO | Attending: Hematology

## 2022-04-09 VITALS — BP 111/62 | HR 83 | Temp 98.7°F | Resp 17 | Ht 69.0 in | Wt 171.8 lb

## 2022-04-09 DIAGNOSIS — C187 Malignant neoplasm of sigmoid colon: Secondary | ICD-10-CM

## 2022-04-09 DIAGNOSIS — Z85038 Personal history of other malignant neoplasm of large intestine: Secondary | ICD-10-CM | POA: Diagnosis not present

## 2022-04-09 DIAGNOSIS — C189 Malignant neoplasm of colon, unspecified: Secondary | ICD-10-CM

## 2022-04-09 DIAGNOSIS — Z08 Encounter for follow-up examination after completed treatment for malignant neoplasm: Secondary | ICD-10-CM | POA: Diagnosis not present

## 2022-04-09 LAB — CBC WITH DIFFERENTIAL (CANCER CENTER ONLY)
Abs Immature Granulocytes: 0.03 10*3/uL (ref 0.00–0.07)
Basophils Absolute: 0 10*3/uL (ref 0.0–0.1)
Basophils Relative: 0 %
Eosinophils Absolute: 0 10*3/uL (ref 0.0–0.5)
Eosinophils Relative: 0 %
HCT: 40 % (ref 39.0–52.0)
Hemoglobin: 14.2 g/dL (ref 13.0–17.0)
Immature Granulocytes: 0 %
Lymphocytes Relative: 7 %
Lymphs Abs: 0.6 10*3/uL — ABNORMAL LOW (ref 0.7–4.0)
MCH: 33 pg (ref 26.0–34.0)
MCHC: 35.5 g/dL (ref 30.0–36.0)
MCV: 93 fL (ref 80.0–100.0)
Monocytes Absolute: 0.5 10*3/uL (ref 0.1–1.0)
Monocytes Relative: 5 %
Neutro Abs: 7.2 10*3/uL (ref 1.7–7.7)
Neutrophils Relative %: 88 %
Platelet Count: 157 10*3/uL (ref 150–400)
RBC: 4.3 MIL/uL (ref 4.22–5.81)
RDW: 11.9 % (ref 11.5–15.5)
WBC Count: 8.3 10*3/uL (ref 4.0–10.5)
nRBC: 0 % (ref 0.0–0.2)

## 2022-04-09 LAB — CMP (CANCER CENTER ONLY)
ALT: 19 U/L (ref 0–44)
AST: 20 U/L (ref 15–41)
Albumin: 4.6 g/dL (ref 3.5–5.0)
Alkaline Phosphatase: 52 U/L (ref 38–126)
Anion gap: 4 — ABNORMAL LOW (ref 5–15)
BUN: 12 mg/dL (ref 6–20)
CO2: 30 mmol/L (ref 22–32)
Calcium: 9.7 mg/dL (ref 8.9–10.3)
Chloride: 107 mmol/L (ref 98–111)
Creatinine: 1.06 mg/dL (ref 0.61–1.24)
GFR, Estimated: >60 mL/min (ref >60)
Glucose, Bld: 98 mg/dL (ref 70–99)
Potassium: 4.5 mmol/L (ref 3.5–5.1)
Sodium: 141 mmol/L (ref 135–145)
Total Bilirubin: 1.2 mg/dL (ref 0.3–1.2)
Total Protein: 6.9 g/dL (ref 6.5–8.1)

## 2022-04-09 LAB — IRON AND IRON BINDING CAPACITY (CC-WL,HP ONLY)
Iron: 169 ug/dL (ref 45–182)
Saturation Ratios: 50 % — ABNORMAL HIGH (ref 17.9–39.5)
TIBC: 339 ug/dL (ref 250–450)
UIBC: 170 ug/dL (ref 117–376)

## 2022-04-09 LAB — FERRITIN: Ferritin: 57 ng/mL (ref 24–336)

## 2022-04-09 LAB — CEA (IN HOUSE-CHCC): CEA (CHCC-In House): 1.29 ng/mL (ref 0.00–5.00)

## 2022-04-09 NOTE — Progress Notes (Signed)
Henry Wall   [phone] Fax:([phone]   Clinic Follow up Note   Patient Care Team: Janith Lima, MD as PCP - General (Internal Medicine) Truitt Merle, MD as Consulting Physician (Oncology) Alla Feeling, NP as Nurse Practitioner (Nurse Practitioner)  Date of Service:  04/09/2022  CHIEF COMPLAINT: f/u of colon cancer  CURRENT THERAPY:  Surveillance  ASSESSMENT & PLAN:  Henry Wall is a 36 y.o. male with   1.  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 Inpatient colonoscopy showed a 4 cm mass in distal sigmoid colon -sigmoid colectomy on 11/30/20 by Dr. Hassell Done confirmed: moderately differentiated adenocarcinoma, 3 cm; lymph nodes and margins negative. -Adjuvant chemo was not recommended, thus he began surveillance  -Circulating tumor DNA GuardiantReveal were not detected 01/31/21, 03/21/21, and 10/30/21. -restaging CT CAP on 12/04/21 showed NED.  -surveillance colonoscopy on 02/20/22 with Dr. Loletha Carrow showed a tubular adenoma. Recall 2026. -he is clinically doing very well, no GI changes or concerns. Labs reviewed, overall WNL. -f/u in 4 months   2. Genetics -He has no prior personal or family history of cancer -path shows MMR normal and MSI-stable, this is not likely lynch syndrome related CRC -due to his young age at diagnosis, he underwent genetic testing which showed no pathogenic mutations, only VUS in ALK and APC genes  -he notes his siblings are aware of his diagnosis. They do not have children.     PLAN: -lab and f/u in 4 months   No problem-specific Assessment & Plan notes found for this encounter.   SUMMARY OF ONCOLOGIC HISTORY: Oncology History Overview Note   Cancer Staging  Cancer of sigmoid colon [facility]) Staging form: Colon and Rectum, AJCC 8th Edition -  Pathologic stage from 11/30/2020: Stage IIA (pT3, pN0, cM0) - Signed by Alla Feeling, NP on 12/12/2020    Cancer of sigmoid colon ([place])  11/24/2020 Miscellaneous   FOBT+ in ED - admitted for further work up    11/24/2020 Imaging   CT AP with contrast IMPRESSION: Normal examination.   11/25/2020 Imaging   CTA abdomen/pelvis IMPRESSION: VASCULAR No acute vascular abnormality is noted. No findings to suggest active GI hemorrhage are seen. NON-VASCULAR No acute abnormality noted.  Unchanged from the previous day.   11/26/2020 Procedure   Colonoscopy, Dr. Wilfrid Lund  A fungating and infiltrative partially obstructing mass was found in the distal sigmoid colon, with the distal aspect about 18-20cm from the anal verge when insufflated. The mass was partially circumferential (involving one-half of the lumen circumference), and the adult colonoscope passed easily. The mass measured about four cm in length. Oozing was present due to friable tissue with scope passage. There was also high grade stiagmata of a visible vessel within the mass that was the source of recent brisk bleeding.   11/26/2020 Tumor Marker   Baseline CEA: 0.9   11/30/2020 Definitive Surgery   Lap assisted sigmoid colectomy by Dr. Johnathan Hausen   11/30/2020 Pathology Results   FINAL MICROSCOPIC DIAGNOSIS:  A. COLON, SIGMOID, RESECTION:  -  Adenocarcinoma, moderately differentiated, 3 cm  -  No carcinoma identified in twenty-seven lymph nodes (0/27)  -  Margins uninvolved by carcinoma  Perforation-not identified Lymphovascular invasion-not identified Perineural invasion-not identified PT3PN0 MMR normal, MSI-stable   11/30/2020 Initial Diagnosis   Malignant tumor of colon ([place])   11/30/2020  Cancer Staging   Staging form: Colon and Rectum, AJCC 8th Edition - Pathologic stage from 11/30/2020: Stage IIA (pT3, pN0, cM0) - Signed by Alla Feeling, NP on 12/12/2020 Total positive nodes: 0 Histologic grading system: 4 grade  system Histologic grade (G): G2 Laterality: Left Lymph-vascular invasion (LVI): LVI not present (absent)/not identified Perineural invasion (PNI): Absent Microsatellite instability (MSI): Stable   01/23/2021 Genetic Testing   Negative genetic testing:  No pathogenic variants detected on the [employer] CancerNext-Expanded + RNAinsight panel. Two variants of uncertain significance (VUS) were detected - one in the ALK gene called p.R1212H and a second in the APC gene called 5'UTR_EX4dup. The report date is 01/23/2021.  The CancerNext-Expanded + RNAinsight gene panel offered by [employer] and includes sequencing and rearrangement analysis for the following 77 genes: AIP, ALK, APC, ATM, AXIN2, BAP1, BARD1, BLM, BMPR1A, BRCA1, BRCA2, BRIP1, CDC73, CDH1, CDK4, CDKN1B, CDKN2A, CHEK2, CTNNA1, DICER1, FANCC, FH, FLCN, GALNT12, KIF1B, LZTR1, MAX, MEN1, MET, MLH1, MSH2, MSH3, MSH6, MUTYH, NBN, NF1, NF2, NTHL1, PALB2, PHOX2B, PMS2, POT1, PRKAR1A, PTCH1, PTEN, RAD51C, RAD51D, RB1, RECQL, RET, SDHA, SDHAF2, SDHB, SDHC, SDHD, SMAD4, SMARCA4, SMARCB1, SMARCE1, STK11, SUFU, TMEM127, TP53, TSC1, TSC2, VHL and XRCC2 (sequencing and deletion/duplication); EGFR, EGLN1, HOXB13, KIT, MITF, PDGFRA, POLD1 and POLE (sequencing only); EPCAM and GREM1 (deletion/duplication only). RNA data is routinely analyzed for use in variant interpretation for all genes.    12/04/2021 Imaging   EXAM: CT CHEST, ABDOMEN, AND PELVIS WITH CONTRAST  IMPRESSION: 1. No evidence of local recurrence or metastatic disease within the chest, abdomen, or pelvis. 2. Large colonic stool burden.      INTERVAL HISTORY:  Henry Wall is here for a follow up of colon cancer. He was last seen by me on 12/07/21. He presents to the clinic alone. He reports he is doing well overall, denies any stomach issues. He notes he is working on muscle gain and is going to the gym regularly.   All other systems were reviewed with the patient and are negative.  MEDICAL  HISTORY:  Past Medical History:  Diagnosis Date   Colon cancer ([place]) 11/26/2020   Family history of lung cancer     SURGICAL HISTORY: Past Surgical History:  Procedure Laterality Date   COLONOSCOPY WITH PROPOFOL N/A 11/26/2020   Procedure: COLONOSCOPY WITH PROPOFOL;  Surgeon: Doran Stabler, MD;  Location: [facility];  Service: Gastroenterology;  Laterality: N/A;   COLOSTOMY N/A 11/30/2020   Procedure: sigmoidectomy;  Surgeon: Johnathan Hausen, MD;  Location: [facility];  Service: General;  Laterality: N/A;   SUBMUCOSAL TATTOO INJECTION  11/26/2020   Procedure: SUBMUCOSAL TATTOO INJECTION;  Surgeon: Doran Stabler, MD;  Location: [facility];  Service: Gastroenterology;;   WISDOM TOOTH EXTRACTION      I have reviewed the social history and family history with the patient and they are unchanged from previous note.  ALLERGIES:  has no allergies on file.  MEDICATIONS:  Current Outpatient Medications  Medication Sig Dispense Refill   Cholecalciferol 1.25 MG (50000 UT) capsule Take 1 capsule (50,000 Units total) by mouth once a week. 12 capsule 1   Melatonin 5 MG CHEW Chew 10 mg by mouth at bedtime.     No current facility-administered medications for this visit.    PHYSICAL EXAMINATION: ECOG PERFORMANCE STATUS: 0 - Asymptomatic  Vitals:   04/09/22 1307  BP: 111/62  Pulse: 83  Resp: 17  Temp: 98.7 F (37.1 C)  SpO2: 100%   Wt Readings from Last  3 Encounters:  04/09/22 171 lb 12.8 oz (77.9 kg)  02/20/22 160 lb (72.6 kg)  01/30/22 160 lb (72.6 kg)     GENERAL:alert, no distress and comfortable SKIN: skin color, texture, turgor are normal, no rashes or significant lesions EYES: normal, Conjunctiva are pink and non-injected, sclera clear  LUNGS: clear to auscultation and percussion with normal breathing effort HEART: regular rate & rhythm and no murmurs and no lower extremity edema ABDOMEN:abdomen soft, non-tender and normal bowel sounds Musculoskeletal:no cyanosis of  digits and no clubbing  NEURO: alert & oriented x 3 with fluent speech, no focal motor/sensory deficits  LABORATORY DATA:  I have reviewed the data as listed    Latest Ref Rng & Units 04/09/2022   12:35 PM 12/04/2021    9:33 AM 10/30/2021    3:30 PM  CBC  WBC 4.0 - 10.5 K/uL 8.3  4.6  9.4   Hemoglobin 13.0 - 17.0 g/dL 14.2  14.8  14.7   Hematocrit 39.0 - 52.0 % 40.0  41.8  41.1   Platelets 150 - 400 K/uL 157  149  161         Latest Ref Rng & Units 04/09/2022   12:35 PM 10/30/2021    3:30 PM 06/29/2021    8:30 AM  CMP  Glucose 70 - 99 mg/dL 98  123  58   BUN 6 - 20 mg/dL 12  17  14    Creatinine 0.61 - 1.24 mg/dL 1.06  1.20  1.09   Sodium 135 - 145 mmol/L 141  139  142   Potassium 3.5 - 5.1 mmol/L 4.5  4.2  4.0   Chloride 98 - 111 mmol/L 107  103  106   CO2 22 - 32 mmol/L 30  28  26    Calcium 8.9 - 10.3 mg/dL 9.7  9.8  9.5   Total Protein 6.5 - 8.1 g/dL 6.9  7.2  7.1   Total Bilirubin 0.3 - 1.2 mg/dL 1.2  1.0  1.0   Alkaline Phos 38 - 126 U/L 52  52  57   AST 15 - 41 U/L 20  21  17    ALT 0 - 44 U/L 19  17  11        RADIOGRAPHIC STUDIES: I have personally reviewed the radiological images as listed and agreed with the findings in the report. No results found.    No orders of the defined types were placed in this encounter.  All questions were answered. The patient knows to call the clinic with any problems, questions or concerns. No barriers to learning was detected. The total time spent in the appointment was 20 minutes.     Truitt Merle, MD 04/09/2022   I, Wilburn Mylar, am acting as scribe for Truitt Merle, MD.   I have reviewed the above documentation for accuracy and completeness, and I agree with the above.

## 2022-07-16 ENCOUNTER — Other Ambulatory Visit: Payer: Self-pay

## 2022-07-16 DIAGNOSIS — C187 Malignant neoplasm of sigmoid colon: Secondary | ICD-10-CM

## 2022-07-16 DIAGNOSIS — C189 Malignant neoplasm of colon, unspecified: Secondary | ICD-10-CM

## 2022-07-25 ENCOUNTER — Telehealth: Payer: Self-pay

## 2022-07-25 NOTE — Telephone Encounter (Signed)
LVM for pt to give Dr. Ernestina Penna office a call regarding his [place] lab appt.  Pt is currently scheduled to see Dr. Burr Medico on 08/08/2022 but his [place] lab is due on 07/30/2022.  Asked if pt is able to come in on 07/30/2022 to have labs drawn; if not, [facility] can schedule a mobile phlebotomy appt with pt to have lab drawn.  Instructed pt to contact Dr. Ernestina Penna office stating if he can come in on 07/30/2022 for labs or if he needs [facility]'s mobile phlebotomy service.  Awaiting pt's return call.

## 2022-08-08 ENCOUNTER — Ambulatory Visit: Payer: Federal, State, Local not specified - PPO | Admitting: Hematology

## 2022-08-08 ENCOUNTER — Other Ambulatory Visit: Payer: Federal, State, Local not specified - PPO

## 2022-08-21 ENCOUNTER — Inpatient Hospital Stay (HOSPITAL_BASED_OUTPATIENT_CLINIC_OR_DEPARTMENT_OTHER): Payer: Federal, State, Local not specified - PPO | Admitting: Hematology

## 2022-08-21 ENCOUNTER — Inpatient Hospital Stay: Payer: Federal, State, Local not specified - PPO | Attending: Hematology

## 2022-08-21 VITALS — BP 129/79 | HR 98 | Temp 98.2°F | Resp 16 | Ht 69.0 in | Wt 171.9 lb

## 2022-08-21 DIAGNOSIS — Z85038 Personal history of other malignant neoplasm of large intestine: Secondary | ICD-10-CM | POA: Insufficient documentation

## 2022-08-21 DIAGNOSIS — Z08 Encounter for follow-up examination after completed treatment for malignant neoplasm: Secondary | ICD-10-CM | POA: Diagnosis not present

## 2022-08-21 DIAGNOSIS — C187 Malignant neoplasm of sigmoid colon: Secondary | ICD-10-CM | POA: Diagnosis not present

## 2022-08-21 DIAGNOSIS — C189 Malignant neoplasm of colon, unspecified: Secondary | ICD-10-CM

## 2022-08-21 LAB — FERRITIN: Ferritin: 66 ng/mL (ref 24–336)

## 2022-08-21 LAB — CBC WITH DIFFERENTIAL (CANCER CENTER ONLY)
Abs Immature Granulocytes: 0 10*3/uL (ref 0.00–0.07)
Basophils Absolute: 0 10*3/uL (ref 0.0–0.1)
Basophils Relative: 1 %
Eosinophils Absolute: 0.1 10*3/uL (ref 0.0–0.5)
Eosinophils Relative: 2 %
HCT: 41.9 % (ref 39.0–52.0)
Hemoglobin: 14.7 g/dL (ref 13.0–17.0)
Immature Granulocytes: 0 %
Lymphocytes Relative: 37 %
Lymphs Abs: 1.7 10*3/uL (ref 0.7–4.0)
MCH: 33.6 pg (ref 26.0–34.0)
MCHC: 35.1 g/dL (ref 30.0–36.0)
MCV: 95.7 fL (ref 80.0–100.0)
Monocytes Absolute: 0.4 10*3/uL (ref 0.1–1.0)
Monocytes Relative: 8 %
Neutro Abs: 2.5 10*3/uL (ref 1.7–7.7)
Neutrophils Relative %: 52 %
Platelet Count: 155 10*3/uL (ref 150–400)
RBC: 4.38 MIL/uL (ref 4.22–5.81)
RDW: 11.9 % (ref 11.5–15.5)
WBC Count: 4.7 10*3/uL (ref 4.0–10.5)
nRBC: 0 % (ref 0.0–0.2)

## 2022-08-21 LAB — CMP (CANCER CENTER ONLY)
ALT: 20 U/L (ref 0–44)
AST: 20 U/L (ref 15–41)
Albumin: 4.8 g/dL (ref 3.5–5.0)
Alkaline Phosphatase: 48 U/L (ref 38–126)
Anion gap: 4 — ABNORMAL LOW (ref 5–15)
BUN: 14 mg/dL (ref 6–20)
CO2: 31 mmol/L (ref 22–32)
Calcium: 9.7 mg/dL (ref 8.9–10.3)
Chloride: 105 mmol/L (ref 98–111)
Creatinine: 1.1 mg/dL (ref 0.61–1.24)
GFR, Estimated: >60 mL/min (ref >60)
Glucose, Bld: 92 mg/dL (ref 70–99)
Potassium: 4.4 mmol/L (ref 3.5–5.1)
Sodium: 140 mmol/L (ref 135–145)
Total Bilirubin: 1.3 mg/dL — ABNORMAL HIGH (ref 0.3–1.2)
Total Protein: 7.2 g/dL (ref 6.5–8.1)

## 2022-08-21 LAB — CEA (IN HOUSE-CHCC): CEA (CHCC-In House): 1.59 ng/mL (ref 0.00–5.00)

## 2022-08-21 LAB — IRON AND IRON BINDING CAPACITY (CC-WL,HP ONLY)
Iron: 200 ug/dL — ABNORMAL HIGH (ref 45–182)
Saturation Ratios: 59 % — ABNORMAL HIGH (ref 17.9–39.5)
TIBC: 339 ug/dL (ref 250–450)
UIBC: 139 ug/dL (ref 117–376)

## 2022-08-21 NOTE — Progress Notes (Unsigned)
[place]   Telephone:(336) [phone] Fax:(336) [phone]   Clinic Follow up Note   Patient Care Team: Janith Lima, MD as PCP - General (Internal Medicine) Truitt Merle, MD as Consulting Physician (Oncology) Alla Feeling, NP as Nurse Practitioner (Nurse Practitioner)  Date of Service:  08/21/2022  CHIEF COMPLAINT: f/u of  colon cancer   CURRENT THERAPY:  Surveillance   ASSESSMENT:  Henry Wall is a 36 y.o. male with      1.  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 Inpatient colonoscopy showed a 4 cm mass in distal sigmoid colon -sigmoid colectomy on 11/30/20 by Dr. Hassell Done confirmed: moderately differentiated adenocarcinoma, 3 cm; lymph nodes and margins negative. -Adjuvant chemo was not recommended, thus he began surveillance  -Circulating tumor DNA GuardiantReveal were not detected 01/31/21, 03/21/21, and 10/30/21. -restaging CT CAP on 12/04/21 showed NED.  -surveillance colonoscopy on 02/20/22 with Dr. Loletha Carrow showed a tubular adenoma. Recall 2026. -he is clinically doing very well, no GI changes or concerns. Labs reviewed, overall WNL. -f/u in 4 months   2. Genetics -He has no prior personal or family history of cancer -path shows MMR normal and MSI-stable, this is not likely lynch syndrome related CRC -due to his young age at diagnosis, he underwent genetic testing which showed no pathogenic mutations, only VUS in ALK and APC genes  -he notes his siblings are aware of his diagnosis. They do not have children.   No problem-specific Assessment & Plan notes found for this encounter.     PLAN: -Lab reviewed -[facility] 360 was done 08/21/2022 results pending -Lab, f/u CT Scan in March  SUMMARY OF ONCOLOGIC HISTORY: Oncology History Overview Note   Cancer Staging  Cancer of sigmoid  colon [facility]) Staging form: Colon and Rectum, AJCC 8th Edition - Pathologic stage from 11/30/2020: Stage IIA (pT3, pN0, cM0) - Signed by Alla Feeling, NP on 12/12/2020    Cancer of sigmoid colon ([place])  11/24/2020 Miscellaneous   FOBT+ in ED - admitted for further work up    11/24/2020 Imaging   CT AP with contrast IMPRESSION: Normal examination.   11/25/2020 Imaging   CTA abdomen/pelvis IMPRESSION: VASCULAR No acute vascular abnormality is noted. No findings to suggest active GI hemorrhage are seen. NON-VASCULAR No acute abnormality noted.  Unchanged from the previous day.   11/26/2020 Procedure   Colonoscopy, Dr. Wilfrid Lund  A fungating and infiltrative partially obstructing mass was found in the distal sigmoid colon, with the distal aspect about 18-20cm from the anal verge when insufflated. The mass was partially circumferential (involving one-half of the lumen circumference), and the adult colonoscope passed easily. The mass measured about four cm in length. Oozing was present due to friable tissue with scope passage. There was also high grade stiagmata of a visible vessel within the mass that was the source of recent brisk bleeding.   11/26/2020 Tumor Marker   Baseline CEA: 0.9   11/30/2020 Definitive Surgery   Lap assisted sigmoid colectomy by Dr. Johnathan Hausen   11/30/2020 Pathology Results   FINAL MICROSCOPIC DIAGNOSIS:  A. COLON, SIGMOID, RESECTION:  -  Adenocarcinoma, moderately differentiated, 3 cm  -  No carcinoma identified in twenty-seven lymph nodes (0/27)  -  Margins uninvolved by carcinoma  Perforation-not identified Lymphovascular invasion-not identified Perineural invasion-not identified PT3PN0 MMR normal, MSI-stable   11/30/2020  Initial Diagnosis   Malignant tumor of colon ([place])   11/30/2020 Cancer Staging   Staging form: Colon and Rectum, AJCC 8th Edition - Pathologic stage from 11/30/2020: Stage IIA (pT3, pN0, cM0) - Signed by Alla Feeling, NP on 12/12/2020 Total  positive nodes: 0 Histologic grading system: 4 grade system Histologic grade (G): G2 Laterality: Left Lymph-vascular invasion (LVI): LVI not present (absent)/not identified Perineural invasion (PNI): Absent Microsatellite instability (MSI): Stable   01/23/2021 Genetic Testing   Negative genetic testing:  No pathogenic variants detected on the [employer] CancerNext-Expanded + RNAinsight panel. Two variants of uncertain significance (VUS) were detected - one in the ALK gene called p.R1212H and a second in the APC gene called 5'UTR_EX4dup. The report date is 01/23/2021.  The CancerNext-Expanded + RNAinsight gene panel offered by [employer] and includes sequencing and rearrangement analysis for the following 77 genes: AIP, ALK, APC, ATM, AXIN2, BAP1, BARD1, BLM, BMPR1A, BRCA1, BRCA2, BRIP1, CDC73, CDH1, CDK4, CDKN1B, CDKN2A, CHEK2, CTNNA1, DICER1, FANCC, FH, FLCN, GALNT12, KIF1B, LZTR1, MAX, MEN1, MET, MLH1, MSH2, MSH3, MSH6, MUTYH, NBN, NF1, NF2, NTHL1, PALB2, PHOX2B, PMS2, POT1, PRKAR1A, PTCH1, PTEN, RAD51C, RAD51D, RB1, RECQL, RET, SDHA, SDHAF2, SDHB, SDHC, SDHD, SMAD4, SMARCA4, SMARCB1, SMARCE1, STK11, SUFU, TMEM127, TP53, TSC1, TSC2, VHL and XRCC2 (sequencing and deletion/duplication); EGFR, EGLN1, HOXB13, KIT, MITF, PDGFRA, POLD1 and POLE (sequencing only); EPCAM and GREM1 (deletion/duplication only). RNA data is routinely analyzed for use in variant interpretation for all genes.    12/04/2021 Imaging   EXAM: CT CHEST, ABDOMEN, AND PELVIS WITH CONTRAST  IMPRESSION: 1. No evidence of local recurrence or metastatic disease within the chest, abdomen, or pelvis. 2. Large colonic stool burden.      INTERVAL HISTORY:  Henry Wall is here for a follow up of colon cancer  He was last seen by me on 04/09/2022 He presents to the clinic alone.Pt states no issues.He's Doing well.   All other systems were reviewed with the patient and are negative.  MEDICAL HISTORY:  Past Medical History:  Diagnosis  Date   Colon cancer ([place]) 11/26/2020   Family history of lung cancer     SURGICAL HISTORY: Past Surgical History:  Procedure Laterality Date   COLONOSCOPY WITH PROPOFOL N/A 11/26/2020   Procedure: COLONOSCOPY WITH PROPOFOL;  Surgeon: Doran Stabler, MD;  [facility];  Service: Gastroenterology;  Laterality: N/A;   COLOSTOMY N/A 11/30/2020   Procedure: sigmoidectomy;  Surgeon: Johnathan Hausen, MD;  Location: [facility];  Service: General;  Laterality: N/A;   SUBMUCOSAL TATTOO INJECTION  11/26/2020   Procedure: SUBMUCOSAL TATTOO INJECTION;  Surgeon: Doran Stabler, MD;  [facility];  Service: Gastroenterology;;   WISDOM TOOTH EXTRACTION      I have reviewed the social history and family history with the patient and they are unchanged from previous note.  ALLERGIES:  has no allergies on file.  MEDICATIONS:  Current Outpatient Medications  Medication Sig Dispense Refill   Cholecalciferol 1.25 MG (50000 UT) capsule Take 1 capsule (50,000 Units total) by mouth once a week. 12 capsule 1   Melatonin 5 MG CHEW Chew 10 mg by mouth at bedtime.     No current facility-administered medications for this visit.    PHYSICAL EXAMINATION: ECOG PERFORMANCE STATUS: {CHL ONC ECOG [phone]}  There were no vitals filed for this visit. Wt Readings from Last 3 Encounters:  04/09/22 171 lb 12.8 oz (77.9 kg)  02/20/22 160 lb (72.6 kg)  01/30/22 160 lb (72.6 kg)  SKIN: skin color, texture, turgor are normal, no rashes or significant lesions EYES: normal, Conjunctiva are pink and non-injected, sclera clear NECK: supple, thyroid normal size, non-tender, without nodularity LYMPH:  no palpable lymphadenopathy in the cervical, axillary  LUNGS: clear to auscultation and percussion with normal breathing effort HEART: regular rate & rhythm and no murmurs and no lower extremity edema ABDOMEN:abdomen soft, non-tender and normal bowel sounds Musculoskeletal:no cyanosis of digits and  no clubbing  NEURO: alert & oriented x 3 with fluent speech, no focal motor/sensory deficits  LABORATORY DATA:  I have reviewed the data as listed    Latest Ref Rng & Units 04/09/2022   12:35 PM 12/04/2021    9:33 AM 10/30/2021    3:30 PM  CBC  WBC 4.0 - 10.5 K/uL 8.3  4.6  9.4   Hemoglobin 13.0 - 17.0 g/dL 14.2  14.8  14.7   Hematocrit 39.0 - 52.0 % 40.0  41.8  41.1   Platelets 150 - 400 K/uL 157  149  161         Latest Ref Rng & Units 04/09/2022   12:35 PM 10/30/2021    3:30 PM 06/29/2021    8:30 AM  CMP  Glucose 70 - 99 mg/dL 98  123  58   BUN 6 - 20 mg/dL _0 Creatinine 0.61 - 1.24 mg/dL 1.06  1.20  1.09   Sodium 135 - 145 mmol/L 141  139  142   Potassium 3.5 - 5.1 mmol/L 4.5  4.2  4.0   Chloride 98 - 111 mmol/L 107  103  106   CO2 22 - 32 mmol/L _1 Calcium 8.9 - 10.3 mg/dL 9.7  9.8  9.5   Total Protein 6.5 - 8.1 g/dL 6.9  7.2  7.1   Total Bilirubin 0.3 - 1.2 mg/dL 1.2  1.0  1.0   Alkaline Phos 38 - 126 U/L 52  52  57   AST 15 - 41 U/L _2 ALT 0 - 44 U/L _3 RADIOGRAPHIC STUDIES: I have personally reviewed the radiological images as listed and agreed with the findings in the report. No results found.    No orders of the defined types were placed in this encounter.  All questions were answered. The patient knows to call the clinic with any problems, questions or concerns. No barriers to learning was detected. The total time spent in the appointment was {CHL ONC TIME VISIT - WEXHB:[id number]}.     Baldemar Friday, CMA 08/21/2022   I, Audry Riles, CMA, am acting as scribe for Truitt Merle, MD.   {Add scribe attestation statement}

## 2022-08-22 ENCOUNTER — Encounter: Payer: Self-pay | Admitting: Hematology

## 2022-09-14 ENCOUNTER — Encounter: Payer: Self-pay | Admitting: Hematology

## 2022-09-18 LAB — GUARDANT 360

## 2022-10-18 ENCOUNTER — Telehealth: Payer: Self-pay

## 2022-10-18 NOTE — Telephone Encounter (Signed)
Pt called stating that he has developed a hemorrhoid and is scared that maybe his cancer has come back.  Pt wants to know what he should do and requesting if Dr. Burr Medico would give him a call.  Notified Dr. Burr Medico.

## 2022-10-19 ENCOUNTER — Telehealth: Payer: Self-pay

## 2022-10-19 ENCOUNTER — Encounter: Payer: Self-pay | Admitting: Hematology

## 2022-10-19 NOTE — Telephone Encounter (Signed)
Spoke with pt via telephone.  Informed pt that Dr. Burr Medico reached out to Dr. Loletha Carrow and Dr. Hassell Done regarding his recent call about his hemorrhoid.  Informed pt that we are waiting to hear back from one of them.  Once they respond, Dr. Ernestina Penna office will reach out to the pt with further directions.

## 2022-10-22 NOTE — Telephone Encounter (Signed)
Called and left patient a detailed vm letting him know that we received a message from Dr. Burr Medico regarding new symptoms. Pt has been scheduled for a follow up with Dr. Loletha Carrow on Thursday, 10/25/22 at 11 am. I left this appt information on patient's vm. I asked that patient call back to confirm this appt or reschedule if it does not work for him.

## 2022-10-22 NOTE — Telephone Encounter (Signed)
Henry Wall,  Patient called oncology last week with symptoms of what he believes to be a hemorrhoid.  (Message sent to me while I was out of the office).  He has not previously had this problem, so it needs to be evaluated in person.  I have an 11:00 appointment open this Thursday, February 1.  Happy to see him then, please contact Corene Cornea.  - HD

## 2022-10-23 NOTE — Telephone Encounter (Signed)
Left patient another vm asking that he call back to confirm appt on 2/1. MyChart message also sent to patient.

## 2022-10-23 NOTE — Telephone Encounter (Signed)
Patient has not returned call, but he did review MyChart message with appt information. Last read by Dolores Frame at  8:24 AM on 10/23/2022.

## 2022-10-25 ENCOUNTER — Encounter: Payer: Self-pay | Admitting: Gastroenterology

## 2022-10-25 ENCOUNTER — Ambulatory Visit: Payer: Federal, State, Local not specified - PPO | Admitting: Gastroenterology

## 2022-10-25 VITALS — BP 104/70 | HR 68 | Ht 69.0 in | Wt 168.1 lb

## 2022-10-25 DIAGNOSIS — K644 Residual hemorrhoidal skin tags: Secondary | ICD-10-CM

## 2022-10-25 NOTE — Progress Notes (Signed)
     [facility] Progress Note  Chief Complaint: External hemorrhoid  Subjective  History: Sigmoid CRC Dx March 2022 (presented with acute GI bleeding) - resection. Several diminutive TA and HPs colonoscopy May 2023.  Healthy anastomosis with granulation tissue (Bx - benign).    3 year recall recommended. Last oncology appt Nov 2023 ________________________  About 2 weeks ago Callin noticed a pressure and fullness around the anal canal, it seems to occur when he may suddenly after he had straining for bowel movement.  Typically his bowels move well without difficulty except medication.  He has been putting some hydrocortisone cream on it, and the swelling is gradually subsiding but still present so he helped to have it evaluated.  He had some concerns about a as regards his previous cancer history, and because he had not experienced something like this before.   The patient's Past Medical, Family and Social History were reviewed and are on file in the EMR.  Objective:  Med list reviewed  Current Outpatient Medications:    Cholecalciferol 1.25 MG (50000 UT) capsule, Take 1 capsule (50,000 Units total) by mouth once a week., Disp: 12 capsule, Rfl: 1   Melatonin 5 MG CHEW, Chew 10 mg by mouth at bedtime., Disp: , Rfl:    Vital signs in last 24 hrs: Vitals:   10/25/22 1104  BP: 104/70  Pulse: 68   Wt Readings from Last 3 Encounters:  10/25/22 168 lb 2 oz (76.3 kg)  08/21/22 171 lb 14.4 oz (78 kg)  04/09/22 171 lb 12.8 oz (77.9 kg)    Physical Exam   Abdomen: soft, no tenderness, with active bowel sounds.  Perianal exam reveals a softly swollen posterior external hemorrhoid, does not feel thrombosed.  Labs:   ___________________________________________ Radiologic studies:   ____________________________________________ Other:   _____________________________________________ Assessment & Plan  Assessment: Encounter Diagnosis  Name Primary?   External hemorrhoid  Yes    First episode of this for him, unrelated to previous malignancy history or surgery. Does not appear thrombosed in need of drainage.  At this point it is causing him a little discomfort, but he says it was never severe even when it first started.  He was just concerned because it had not completely resolved.  I think it we will do so with some time and use of a Preparation H ointment (containing phenylephrine) rather than steroid.  If it does not completely resolve, he will let me know and we will ask his surgeon to take a look.   Nelida Meuse III

## 2022-10-25 NOTE — Patient Instructions (Signed)
_______________________________________________________  If your blood pressure at your visit was 140/90 or greater, please contact your primary care physician to follow up on this.  _______________________________________________________  If you are age 39 or older, your body mass index should be between 23-30. Your Body mass index is 24.83 kg/m. If this is out of the aforementioned range listed, please consider follow up with your Primary Care Provider.  If you are age 70 or younger, your body mass index should be between 19-25. Your Body mass index is 24.83 kg/m. If this is out of the aformentioned range listed, please consider follow up with your Primary Care Provider.   ________________________________________________________  The [facility] providers would like to encourage you to use [facility] to communicate with providers for non-urgent requests or questions.  Due to long hold times on the telephone, sending your provider a message by [facility] may be a faster and more efficient way to get a response.  Please allow 48 business hours for a response.  Please remember that this is for non-urgent requests.  _______________________________________________________  It was a pleasure to see you today!  Thank you for trusting me with your gastrointestinal care!

## 2022-11-09 ENCOUNTER — Other Ambulatory Visit: Payer: Self-pay

## 2022-11-09 ENCOUNTER — Telehealth: Payer: Self-pay

## 2022-11-09 NOTE — Telephone Encounter (Signed)
LVM stating that pt is due for his [place] lab no later than 11/21/2022.  Pt's last Guardant Reveal was drawn 08/22/2023 and pt is on an Q3Month Schedule.  This RN will also send pt a MyChart message as well.

## 2022-11-21 ENCOUNTER — Other Ambulatory Visit: Payer: Self-pay

## 2022-11-21 ENCOUNTER — Inpatient Hospital Stay: Payer: Federal, State, Local not specified - PPO | Attending: Hematology

## 2022-11-21 DIAGNOSIS — C187 Malignant neoplasm of sigmoid colon: Secondary | ICD-10-CM

## 2022-11-21 DIAGNOSIS — C189 Malignant neoplasm of colon, unspecified: Secondary | ICD-10-CM

## 2022-11-21 LAB — CBC WITH DIFFERENTIAL (CANCER CENTER ONLY)
Abs Immature Granulocytes: 0.01 10*3/uL (ref 0.00–0.07)
Basophils Absolute: 0 10*3/uL (ref 0.0–0.1)
Basophils Relative: 1 %
Eosinophils Absolute: 0.1 10*3/uL (ref 0.0–0.5)
Eosinophils Relative: 2 %
HCT: 42.2 % (ref 39.0–52.0)
Hemoglobin: 14.9 g/dL (ref 13.0–17.0)
Immature Granulocytes: 0 %
Lymphocytes Relative: 37 %
Lymphs Abs: 1.7 10*3/uL (ref 0.7–4.0)
MCH: 33.1 pg (ref 26.0–34.0)
MCHC: 35.3 g/dL (ref 30.0–36.0)
MCV: 93.8 fL (ref 80.0–100.0)
Monocytes Absolute: 0.4 10*3/uL (ref 0.1–1.0)
Monocytes Relative: 9 %
Neutro Abs: 2.4 10*3/uL (ref 1.7–7.7)
Neutrophils Relative %: 51 %
Platelet Count: 167 10*3/uL (ref 150–400)
RBC: 4.5 MIL/uL (ref 4.22–5.81)
RDW: 12 % (ref 11.5–15.5)
WBC Count: 4.7 10*3/uL (ref 4.0–10.5)
nRBC: 0 % (ref 0.0–0.2)

## 2022-11-21 LAB — CMP (CANCER CENTER ONLY)
ALT: 16 U/L (ref 0–44)
AST: 19 U/L (ref 15–41)
Albumin: 4.5 g/dL (ref 3.5–5.0)
Alkaline Phosphatase: 48 U/L (ref 38–126)
Anion gap: 5 (ref 5–15)
BUN: 10 mg/dL (ref 6–20)
CO2: 31 mmol/L (ref 22–32)
Calcium: 9.1 mg/dL (ref 8.9–10.3)
Chloride: 106 mmol/L (ref 98–111)
Creatinine: 1 mg/dL (ref 0.61–1.24)
GFR, Estimated: >60 mL/min (ref >60)
Glucose, Bld: 66 mg/dL — ABNORMAL LOW (ref 70–99)
Potassium: 4.2 mmol/L (ref 3.5–5.1)
Sodium: 142 mmol/L (ref 135–145)
Total Bilirubin: 1 mg/dL (ref 0.3–1.2)
Total Protein: 6.3 g/dL — ABNORMAL LOW (ref 6.5–8.1)

## 2022-11-21 LAB — IRON AND IRON BINDING CAPACITY (CC-WL,HP ONLY)
Iron: 115 ug/dL (ref 45–182)
Saturation Ratios: 34 % (ref 17.9–39.5)
TIBC: 343 ug/dL (ref 250–450)
UIBC: 228 ug/dL (ref 117–376)

## 2022-11-21 LAB — CEA (IN HOUSE-CHCC): CEA (CHCC-In House): 1.33 ng/mL (ref 0.00–5.00)

## 2022-11-21 LAB — FERRITIN: Ferritin: 67 ng/mL (ref 24–336)

## 2022-11-30 ENCOUNTER — Encounter: Payer: Self-pay | Admitting: Hematology

## 2022-11-30 LAB — GUARDANT 360

## 2022-12-17 ENCOUNTER — Ambulatory Visit (HOSPITAL_COMMUNITY)
Admission: RE | Admit: 2022-12-17 | Discharge: 2022-12-17 | Disposition: A | Discharge status: Home / Self Care | Payer: Federal, State, Local not specified - PPO | Source: Ambulatory Visit | Attending: Hematology | Admitting: Hematology

## 2022-12-17 ENCOUNTER — Inpatient Hospital Stay: Payer: Federal, State, Local not specified - PPO | Attending: Hematology

## 2022-12-17 DIAGNOSIS — C187 Malignant neoplasm of sigmoid colon: Secondary | ICD-10-CM

## 2022-12-17 DIAGNOSIS — C189 Malignant neoplasm of colon, unspecified: Secondary | ICD-10-CM

## 2022-12-17 DIAGNOSIS — K3189 Other diseases of stomach and duodenum: Secondary | ICD-10-CM | POA: Diagnosis not present

## 2022-12-17 DIAGNOSIS — Z85038 Personal history of other malignant neoplasm of large intestine: Secondary | ICD-10-CM | POA: Insufficient documentation

## 2022-12-17 LAB — FERRITIN: Ferritin: 58 ng/mL (ref 24–336)

## 2022-12-17 LAB — CMP (CANCER CENTER ONLY)
ALT: 18 U/L (ref 0–44)
AST: 19 U/L (ref 15–41)
Albumin: 4.6 g/dL (ref 3.5–5.0)
Alkaline Phosphatase: 50 U/L (ref 38–126)
Anion gap: 5 (ref 5–15)
BUN: 16 mg/dL (ref 6–20)
CO2: 30 mmol/L (ref 22–32)
Calcium: 9.4 mg/dL (ref 8.9–10.3)
Chloride: 105 mmol/L (ref 98–111)
Creatinine: 1.11 mg/dL (ref 0.61–1.24)
GFR, Estimated: >60 mL/min (ref >60)
Glucose, Bld: 87 mg/dL (ref 70–99)
Potassium: 4.2 mmol/L (ref 3.5–5.1)
Sodium: 140 mmol/L (ref 135–145)
Total Bilirubin: 1.1 mg/dL (ref 0.3–1.2)
Total Protein: 6.7 g/dL (ref 6.5–8.1)

## 2022-12-17 LAB — CBC WITH DIFFERENTIAL (CANCER CENTER ONLY)
Abs Immature Granulocytes: 0 10*3/uL (ref 0.00–0.07)
Basophils Absolute: 0 10*3/uL (ref 0.0–0.1)
Basophils Relative: 1 %
Eosinophils Absolute: 0.1 10*3/uL (ref 0.0–0.5)
Eosinophils Relative: 3 %
HCT: 44.7 % (ref 39.0–52.0)
Hemoglobin: 15.6 g/dL (ref 13.0–17.0)
Immature Granulocytes: 0 %
Lymphocytes Relative: 43 %
Lymphs Abs: 2 10*3/uL (ref 0.7–4.0)
MCH: 33.1 pg (ref 26.0–34.0)
MCHC: 34.9 g/dL (ref 30.0–36.0)
MCV: 94.7 fL (ref 80.0–100.0)
Monocytes Absolute: 0.4 10*3/uL (ref 0.1–1.0)
Monocytes Relative: 8 %
Neutro Abs: 2.1 10*3/uL (ref 1.7–7.7)
Neutrophils Relative %: 45 %
Platelet Count: 160 10*3/uL (ref 150–400)
RBC: 4.72 MIL/uL (ref 4.22–5.81)
RDW: 11.9 % (ref 11.5–15.5)
WBC Count: 4.6 10*3/uL (ref 4.0–10.5)
nRBC: 0 % (ref 0.0–0.2)

## 2022-12-17 LAB — IRON AND IRON BINDING CAPACITY (CC-WL,HP ONLY)
Iron: 199 ug/dL — ABNORMAL HIGH (ref 45–182)
Saturation Ratios: 59 % — ABNORMAL HIGH (ref 17.9–39.5)
TIBC: 336 ug/dL (ref 250–450)
UIBC: 137 ug/dL (ref 117–376)

## 2022-12-17 LAB — CEA (IN HOUSE-CHCC): CEA (CHCC-In House): 1.37 ng/mL (ref 0.00–5.00)

## 2022-12-17 MED ORDER — IOHEXOL 9 MG/ML PO SOLN: 500.0000 mL | ORAL | Status: DC

## 2022-12-17 MED ORDER — IOHEXOL 300 MG/ML  SOLN
100.0000 mL | Freq: Once | INTRAMUSCULAR | Status: AC | PRN
  Administered 2022-12-17: 100 mL via INTRAVENOUS

## 2022-12-17 MED ORDER — SODIUM CHLORIDE (PF) 0.9 % IJ SOLN
INTRAMUSCULAR | Status: AC
  Filled 2022-12-17: qty 50

## 2022-12-17 MED ORDER — IOHEXOL 9 MG/ML PO SOLN
1000.0000 mL | ORAL | Status: AC
  Administered 2022-12-17: 1000 mL via ORAL

## 2022-12-23 NOTE — Progress Notes (Unsigned)
Patient Care Team: Janith Lima, MD as PCP - General (Internal Medicine) Truitt Merle, MD as Consulting Physician (Oncology) Alla Feeling, NP as Nurse Practitioner (Nurse Practitioner)   CHIEF COMPLAINT: Follow up sigmoid colon cancer   Oncology History Overview Note   Cancer Staging  Cancer of sigmoid colon [facility]) Staging form: Colon and Rectum, AJCC 8th Edition - Pathologic stage from 11/30/2020: Stage IIA (pT3, pN0, cM0) - Signed by Alla Feeling, NP on 12/12/2020    Cancer of sigmoid colon ([place])  11/24/2020 Miscellaneous   FOBT+ in ED - admitted for further work up    11/24/2020 Imaging   CT AP with contrast IMPRESSION: Normal examination.   11/25/2020 Imaging   CTA abdomen/pelvis IMPRESSION: VASCULAR No acute vascular abnormality is noted. No findings to suggest active GI hemorrhage are seen. NON-VASCULAR No acute abnormality noted.  Unchanged from the previous day.   11/26/2020 Procedure   Colonoscopy, Dr. Wilfrid Lund  A fungating and infiltrative partially obstructing mass was found in the distal sigmoid colon, with the distal aspect about 18-20cm from the anal verge when insufflated. The mass was partially circumferential (involving one-half of the lumen circumference), and the adult colonoscope passed easily. The mass measured about four cm in length. Oozing was present due to friable tissue with scope passage. There was also high grade stiagmata of a visible vessel within the mass that was the source of recent brisk bleeding.   11/26/2020 Tumor Marker   Baseline CEA: 0.9   11/30/2020 Definitive Surgery   Lap assisted sigmoid colectomy by Dr. Johnathan Hausen   11/30/2020 Pathology Results   FINAL MICROSCOPIC DIAGNOSIS:  A. COLON, SIGMOID, RESECTION:  -  Adenocarcinoma, moderately differentiated, 3 cm  -  No carcinoma identified in twenty-seven lymph nodes (0/27)  -  Margins uninvolved by carcinoma  Perforation-not identified Lymphovascular invasion-not  identified Perineural invasion-not identified PT3PN0 MMR normal, MSI-stable   11/30/2020 Initial Diagnosis   Malignant tumor of colon ([place])   11/30/2020 Cancer Staging   Staging form: Colon and Rectum, AJCC 8th Edition - Pathologic stage from 11/30/2020: Stage IIA (pT3, pN0, cM0) - Signed by Alla Feeling, NP on 12/12/2020 Total positive nodes: 0 Histologic grading system: 4 grade system Histologic grade (G): G2 Laterality: Left Lymph-vascular invasion (LVI): LVI not present (absent)/not identified Perineural invasion (PNI): Absent Microsatellite instability (MSI): Stable   01/23/2021 Genetic Testing   Negative genetic testing:  No pathogenic variants detected on the [employer] CancerNext-Expanded + RNAinsight panel. Two variants of uncertain significance (VUS) were detected - one in the ALK gene called p.R1212H and a second in the APC gene called 5'UTR_EX4dup. The report date is 01/23/2021.  The CancerNext-Expanded + RNAinsight gene panel offered by [employer] and includes sequencing and rearrangement analysis for the following 77 genes: AIP, ALK, APC, ATM, AXIN2, BAP1, BARD1, BLM, BMPR1A, BRCA1, BRCA2, BRIP1, CDC73, CDH1, CDK4, CDKN1B, CDKN2A, CHEK2, CTNNA1, DICER1, FANCC, FH, FLCN, GALNT12, KIF1B, LZTR1, MAX, MEN1, MET, MLH1, MSH2, MSH3, MSH6, MUTYH, NBN, NF1, NF2, NTHL1, PALB2, PHOX2B, PMS2, POT1, PRKAR1A, PTCH1, PTEN, RAD51C, RAD51D, RB1, RECQL, RET, SDHA, SDHAF2, SDHB, SDHC, SDHD, SMAD4, SMARCA4, SMARCB1, SMARCE1, STK11, SUFU, TMEM127, TP53, TSC1, TSC2, VHL and XRCC2 (sequencing and deletion/duplication); EGFR, EGLN1, HOXB13, KIT, MITF, PDGFRA, POLD1 and POLE (sequencing only); EPCAM and GREM1 (deletion/duplication only). RNA data is routinely analyzed for use in variant interpretation for all genes.    12/04/2021 Imaging   EXAM: CT CHEST, ABDOMEN, AND PELVIS WITH CONTRAST  IMPRESSION: 1. No evidence  of local recurrence or metastatic disease within the chest, abdomen, or pelvis. 2. Large  colonic stool burden.      CURRENT THERAPY: Surveillance   INTERVAL HISTORY Mr. Trickett returns for follow up as scheduled. Last seen by Dr. Burr Medico 08/21/22. Guardant reveal ctDNA from 11/21/22 was not detected. He had surveillance scan 12/17/22.   ROS   Past Medical History:  Diagnosis Date   Colon cancer ([place]) 11/26/2020   Family history of lung cancer      Past Surgical History:  Procedure Laterality Date   COLONOSCOPY WITH PROPOFOL N/A 11/26/2020   Procedure: COLONOSCOPY WITH PROPOFOL;  Surgeon: Doran Stabler, MD;  Location: [facility];  Service: Gastroenterology;  Laterality: N/A;   COLOSTOMY N/A 11/30/2020   Procedure: sigmoidectomy;  Surgeon: Johnathan Hausen, MD;  Location: [facility];  Service: General;  Laterality: N/A;   SUBMUCOSAL TATTOO INJECTION  11/26/2020   Procedure: SUBMUCOSAL TATTOO INJECTION;  Surgeon: Doran Stabler, MD;  Location: [facility];  Service: Gastroenterology;;   WISDOM TOOTH EXTRACTION       Outpatient Encounter Medications as of 12/24/2022  Medication Sig   Cholecalciferol 1.25 MG (50000 UT) capsule Take 1 capsule (50,000 Units total) by mouth once a week.   Melatonin 5 MG CHEW Chew 10 mg by mouth at bedtime.   No facility-administered encounter medications on file as of 12/24/2022.     There were no vitals filed for this visit. There is no height or weight on file to calculate BMI.   PHYSICAL EXAM GENERAL:alert, no distress and comfortable SKIN: no rash  EYES: sclera clear NECK: without mass LYMPH:  no palpable cervical or supraclavicular lymphadenopathy  LUNGS: clear with normal breathing effort HEART: regular rate & rhythm, no lower extremity edema ABDOMEN: abdomen soft, non-tender and normal bowel sounds NEURO: alert & oriented x 3 with fluent speech, no focal motor/sensory deficits Breast exam:  PAC without erythema    CBC    Component Value Date/Time   WBC 4.6 12/17/2022 0824   WBC 8.4 12/02/2020 0548   RBC 4.72 12/17/2022 0824    HGB 15.6 12/17/2022 0824   HCT 44.7 12/17/2022 0824   PLT 160 12/17/2022 0824   MCV 94.7 12/17/2022 0824   MCH 33.1 12/17/2022 0824   MCHC 34.9 12/17/2022 0824   RDW 11.9 12/17/2022 0824   LYMPHSABS 2.0 12/17/2022 0824   MONOABS 0.4 12/17/2022 0824   EOSABS 0.1 12/17/2022 0824   BASOSABS 0.0 12/17/2022 0824     CMP     Component Value Date/Time   NA 140 12/17/2022 0824   K 4.2 12/17/2022 0824   CL 105 12/17/2022 0824   CO2 30 12/17/2022 0824   GLUCOSE 87 12/17/2022 0824   BUN 16 12/17/2022 0824   CREATININE 1.11 12/17/2022 0824   CALCIUM 9.4 12/17/2022 0824   PROT 6.7 12/17/2022 0824   ALBUMIN 4.6 12/17/2022 0824   AST 19 12/17/2022 0824   ALT 18 12/17/2022 0824   ALKPHOS 50 12/17/2022 0824   BILITOT 1.1 12/17/2022 0824   GFRNONAA >60 12/17/2022 0824     ASSESSMENT & PLAN:37 year old male with no significant PMH or family history   1.  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 Hassell Done. Adjuvant chemo was not recommended.  -He began surveillance and vitamin D.  -Serial ctDNA not detected -Surveillance CT 11/2021 NED and colonoscopy 01/2022 with tubular adenoma, 3 year recall  2. IDA, secondary to #1 -during hospital work up he was found to be anemic Hgb lowest 10.4 while in the hospital.   -anemia resolved, he stopped oral iron   3. Genetics -He has no prior personal or family history of cancer -path shows MMR normal and MSI-stable, this is not likely lynch syndrome related CRC -due to his young age at diagnosis, he underwent genetic testing which showed no pathogenic mutations, only VUS in ALK and APC genes   PLAN:  No orders of the defined types were placed in this encounter.     All questions were answered. The patient knows to call the clinic with any problems, questions or concerns. No barriers to learning were detected. I spent *** counseling  the patient face to face. The total time spent in the appointment was *** and more than 50% was on counseling, review of test results, and coordination of care.   Cira Rue, NP-C @DATE @

## 2022-12-24 ENCOUNTER — Inpatient Hospital Stay: Payer: Federal, State, Local not specified - PPO | Attending: Hematology | Admitting: Nurse Practitioner

## 2022-12-24 ENCOUNTER — Encounter: Payer: Self-pay | Admitting: Nurse Practitioner

## 2022-12-24 ENCOUNTER — Other Ambulatory Visit: Payer: Self-pay

## 2022-12-24 VITALS — BP 128/71 | HR 64 | Temp 98.2°F | Resp 15 | Wt 168.9 lb

## 2022-12-24 DIAGNOSIS — Z85038 Personal history of other malignant neoplasm of large intestine: Secondary | ICD-10-CM | POA: Diagnosis not present

## 2022-12-24 DIAGNOSIS — C187 Malignant neoplasm of sigmoid colon: Secondary | ICD-10-CM | POA: Diagnosis not present

## 2022-12-24 DIAGNOSIS — Z08 Encounter for follow-up examination after completed treatment for malignant neoplasm: Secondary | ICD-10-CM | POA: Diagnosis not present

## 2023-02-20 ENCOUNTER — Other Ambulatory Visit: Payer: Self-pay

## 2023-02-22 ENCOUNTER — Other Ambulatory Visit: Payer: Self-pay

## 2023-02-22 DIAGNOSIS — C187 Malignant neoplasm of sigmoid colon: Secondary | ICD-10-CM

## 2023-02-22 DIAGNOSIS — C189 Malignant neoplasm of colon, unspecified: Secondary | ICD-10-CM

## 2023-06-24 ENCOUNTER — Inpatient Hospital Stay: Payer: Federal, State, Local not specified - PPO | Attending: Hematology

## 2023-06-24 DIAGNOSIS — C189 Malignant neoplasm of colon, unspecified: Secondary | ICD-10-CM

## 2023-06-24 DIAGNOSIS — C187 Malignant neoplasm of sigmoid colon: Secondary | ICD-10-CM

## 2023-07-03 ENCOUNTER — Encounter: Payer: Self-pay | Admitting: Hematology

## 2023-07-05 LAB — GUARDANT REVEAL

## 2023-07-06 ENCOUNTER — Other Ambulatory Visit: Payer: Self-pay | Admitting: Nurse Practitioner

## 2023-07-06 DIAGNOSIS — C187 Malignant neoplasm of sigmoid colon: Secondary | ICD-10-CM

## 2023-07-06 NOTE — Progress Notes (Deleted)
Patient Care Team: Etta Grandchild, MD as PCP - General (Internal Medicine) Malachy Mood, MD as Consulting Physician (Oncology) Pollyann Samples, NP as Nurse Practitioner (Nurse Practitioner)   CHIEF COMPLAINT: Follow up sigmoid colon cancer   Oncology History Overview Note   Cancer Staging  Cancer of sigmoid colon [facility]) Staging form: Colon and Rectum, AJCC 8th Edition - Pathologic stage from 11/30/2020: Stage IIA (pT3, pN0, cM0) - Signed by Pollyann Samples, NP on 12/12/2020    Cancer of sigmoid colon ([facility])  11/24/2020 Miscellaneous   FOBT+ in ED - admitted for further work up    11/24/2020 Imaging   CT AP with contrast IMPRESSION: Normal examination.   11/25/2020 Imaging   CTA abdomen/pelvis IMPRESSION: VASCULAR No acute vascular abnormality is noted. No findings to suggest active GI hemorrhage are seen. NON-VASCULAR No acute abnormality noted.  Unchanged from the previous day.   11/26/2020 Procedure   Colonoscopy, Dr. Amada Jupiter  A fungating and infiltrative partially obstructing mass was found in the distal sigmoid colon, with the distal aspect about 18-20cm from the anal verge when insufflated. The mass was partially circumferential (involving one-half of the lumen circumference), and the adult colonoscope passed easily. The mass measured about four cm in length. Oozing was present due to friable tissue with scope passage. There was also high grade stiagmata of a visible vessel within the mass that was the source of recent brisk bleeding.   11/26/2020 Tumor Marker   Baseline CEA: 0.9   11/30/2020 Definitive Surgery   Lap assisted sigmoid colectomy by Dr. Luretha Murphy   11/30/2020 Pathology Results   FINAL MICROSCOPIC DIAGNOSIS:  A. COLON, SIGMOID, RESECTION:  -  Adenocarcinoma, moderately differentiated, 3 cm  -  No carcinoma identified in twenty-seven lymph nodes (0/27)  -  Margins uninvolved by carcinoma  Perforation-not identified Lymphovascular invasion-not  identified Perineural invasion-not identified PT3PN0 MMR normal, MSI-stable   11/30/2020 Initial Diagnosis   Malignant tumor of colon ([facility])   11/30/2020 Cancer Staging   Staging form: Colon and Rectum, AJCC 8th Edition - Pathologic stage from 11/30/2020: Stage IIA (pT3, pN0, cM0) - Signed by Pollyann Samples, NP on 12/12/2020 Total positive nodes: 0 Histologic grading system: 4 grade system Histologic grade (G): G2 Laterality: Left Lymph-vascular invasion (LVI): LVI not present (absent)/not identified Perineural invasion (PNI): Absent Microsatellite instability (MSI): Stable   01/23/2021 Genetic Testing   Negative genetic testing:  No pathogenic variants detected on the [employer] CancerNext-Expanded + RNAinsight panel. Two variants of uncertain significance (VUS) were detected - one in the ALK gene called p.R1212H and a second in the APC gene called 5'UTR_EX4dup. The report date is 01/23/2021.  The CancerNext-Expanded + RNAinsight gene panel offered by [employer] and includes sequencing and rearrangement analysis for the following 77 genes: AIP, ALK, APC, ATM, AXIN2, BAP1, BARD1, BLM, BMPR1A, BRCA1, BRCA2, BRIP1, CDC73, CDH1, CDK4, CDKN1B, CDKN2A, CHEK2, CTNNA1, DICER1, FANCC, FH, FLCN, GALNT12, KIF1B, LZTR1, MAX, MEN1, MET, MLH1, MSH2, MSH3, MSH6, MUTYH, NBN, NF1, NF2, NTHL1, PALB2, PHOX2B, PMS2, POT1, PRKAR1A, PTCH1, PTEN, RAD51C, RAD51D, RB1, RECQL, RET, SDHA, SDHAF2, SDHB, SDHC, SDHD, SMAD4, SMARCA4, SMARCB1, SMARCE1, STK11, SUFU, TMEM127, TP53, TSC1, TSC2, VHL and XRCC2 (sequencing and deletion/duplication); EGFR, EGLN1, HOXB13, KIT, MITF, PDGFRA, POLD1 and POLE (sequencing only); EPCAM and GREM1 (deletion/duplication only). RNA data is routinely analyzed for use in variant interpretation for all genes.    12/04/2021 Imaging   EXAM: CT CHEST, ABDOMEN, AND PELVIS WITH CONTRAST  IMPRESSION: 1. No evidence  of local recurrence or metastatic disease within the chest, abdomen, or pelvis. 2. Large  colonic stool burden.      CURRENT THERAPY: Surveillance  INTERVAL HISTORY Mr. Kempner returns for follow up as scheduled. Last seen by me 12/24/22. Guardant Reveal 9/30 is not detected  ROS   Past Medical History:  Diagnosis Date   Colon cancer ([facility]) 11/26/2020   Family history of lung cancer      Past Surgical History:  Procedure Laterality Date   COLONOSCOPY WITH PROPOFOL N/A 11/26/2020   Procedure: COLONOSCOPY WITH PROPOFOL;  Surgeon: Sherrilyn Rist, MD;  Location: [facility];  Service: Gastroenterology;  Laterality: N/A;   COLOSTOMY N/A 11/30/2020   Procedure: sigmoidectomy;  Surgeon: Luretha Murphy, MD;  Location: [facility];  Service: General;  Laterality: N/A;   SUBMUCOSAL TATTOO INJECTION  11/26/2020   Procedure: SUBMUCOSAL TATTOO INJECTION;  Surgeon: Sherrilyn Rist, MD;  Location: [facility];  Service: Gastroenterology;;   WISDOM TOOTH EXTRACTION       Outpatient Encounter Medications as of 07/08/2023  Medication Sig   Cholecalciferol 1.25 MG (50000 UT) capsule Take 1 capsule (50,000 Units total) by mouth once a week.   Melatonin 5 MG CHEW Chew 10 mg by mouth at bedtime.   No facility-administered encounter medications on file as of 07/08/2023.     There were no vitals filed for this visit. There is no height or weight on file to calculate BMI.   PHYSICAL EXAM GENERAL:alert, no distress and comfortable SKIN: no rash  EYES: sclera clear NECK: without mass LYMPH:  no palpable cervical or supraclavicular lymphadenopathy  LUNGS: clear with normal breathing effort HEART: regular rate & rhythm, no lower extremity edema ABDOMEN: abdomen soft, non-tender and normal bowel sounds NEURO: alert & oriented x 3 with fluent speech, no focal motor/sensory deficits Breast exam:  PAC without erythema    CBC    Component Value Date/Time   WBC 4.6 12/17/2022 0824   WBC 8.4 12/02/2020 0548   RBC 4.72 12/17/2022 0824   HGB 15.6 12/17/2022 0824   HCT 44.7 12/17/2022 0824    PLT 160 12/17/2022 0824   MCV 94.7 12/17/2022 0824   MCH 33.1 12/17/2022 0824   MCHC 34.9 12/17/2022 0824   RDW 11.9 12/17/2022 0824   LYMPHSABS 2.0 12/17/2022 0824   MONOABS 0.4 12/17/2022 0824   EOSABS 0.1 12/17/2022 0824   BASOSABS 0.0 12/17/2022 0824     CMP     Component Value Date/Time   NA 140 12/17/2022 0824   K 4.2 12/17/2022 0824   CL 105 12/17/2022 0824   CO2 30 12/17/2022 0824   GLUCOSE 87 12/17/2022 0824   BUN 16 12/17/2022 0824   CREATININE 1.11 12/17/2022 0824   CALCIUM 9.4 12/17/2022 0824   PROT 6.7 12/17/2022 0824   ALBUMIN 4.6 12/17/2022 0824   AST 19 12/17/2022 0824   ALT 18 12/17/2022 0824   ALKPHOS 50 12/17/2022 0824   BILITOT 1.1 12/17/2022 0824   GFRNONAA >60 12/17/2022 0824     ASSESSMENT & PLAN:37 year old male with no significant PMH or family history   1.  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  Diagnosed 11/2020, s/p sigmoid colectomy by Dr. Daphine Deutscher.  -No high risk features on path, adjuvant chemo was not recommended.  -He began surveillance and vitamin D.  -Serial ctDNA not detected, most recently 9/30 -Colonoscopy 01/2022 with tubular adenoma, 3 year recall; CT 12/17/22 showed  NED   2. Genetics -He has no prior personal or family history of cancer -path shows MMR normal and MSI-stable, this is not likely lynch syndrome related CRC -due to his young age at diagnosis, he underwent genetic testing which showed no pathogenic mutations, only VUS in ALK and APC genes -Patient has no children.  Siblings have been screened      PLAN:  No orders of the defined types were placed in this encounter.     All questions were answered. The patient knows to call the clinic with any problems, questions or concerns. No barriers to learning were detected. I spent *** counseling the patient face to face. The total time spent in the appointment was *** and more than 50%  was on counseling, review of test results, and coordination of care.   Santiago Glad, NP-C @DATE @

## 2023-07-08 ENCOUNTER — Inpatient Hospital Stay: Payer: Federal, State, Local not specified - PPO | Admitting: Nurse Practitioner

## 2023-07-08 ENCOUNTER — Telehealth: Payer: Self-pay | Admitting: Nurse Practitioner

## 2023-07-14 NOTE — Progress Notes (Deleted)
Patient Care Team: Etta Grandchild, MD as PCP - General (Internal Medicine) Malachy Mood, MD as Consulting Physician (Oncology) Pollyann Samples, NP as Nurse Practitioner (Nurse Practitioner)   CHIEF COMPLAINT: Follow up sigmoid colon cancer   Oncology History Overview Note   Cancer Staging  Cancer of sigmoid colon [facility]) Staging form: Colon and Rectum, AJCC 8th Edition - Pathologic stage from 11/30/2020: Stage IIA (pT3, pN0, cM0) - Signed by Pollyann Samples, NP on 12/12/2020    Cancer of sigmoid colon (HCC)  11/24/2020 Miscellaneous   FOBT+ in ED - admitted for further work up    11/24/2020 Imaging   CT AP with contrast IMPRESSION: Normal examination.   11/25/2020 Imaging   CTA abdomen/pelvis IMPRESSION: VASCULAR No acute vascular abnormality is noted. No findings to suggest active GI hemorrhage are seen. NON-VASCULAR No acute abnormality noted.  Unchanged from the previous day.   11/26/2020 Procedure   Colonoscopy, Dr. Amada Jupiter  A fungating and infiltrative partially obstructing mass was found in the distal sigmoid colon, with the distal aspect about 18-20cm from the anal verge when insufflated. The mass was partially circumferential (involving one-half of the lumen circumference), and the adult colonoscope passed easily. The mass measured about four cm in length. Oozing was present due to friable tissue with scope passage. There was also high grade stiagmata of a visible vessel within the mass that was the source of recent brisk bleeding.   11/26/2020 Tumor Marker   Baseline CEA: 0.9   11/30/2020 Definitive Surgery   Lap assisted sigmoid colectomy by Dr. Luretha Murphy   11/30/2020 Pathology Results   FINAL MICROSCOPIC DIAGNOSIS:  A. COLON, SIGMOID, RESECTION:  -  Adenocarcinoma, moderately differentiated, 3 cm  -  No carcinoma identified in twenty-seven lymph nodes (0/27)  -  Margins uninvolved by carcinoma  Perforation-not identified Lymphovascular invasion-not  identified Perineural invasion-not identified PT3PN0 MMR normal, MSI-stable   11/30/2020 Initial Diagnosis   Malignant tumor of colon (HCC)   11/30/2020 Cancer Staging   Staging form: Colon and Rectum, AJCC 8th Edition - Pathologic stage from 11/30/2020: Stage IIA (pT3, pN0, cM0) - Signed by Pollyann Samples, NP on 12/12/2020 Total positive nodes: 0 Histologic grading system: 4 grade system Histologic grade (G): G2 Laterality: Left Lymph-vascular invasion (LVI): LVI not present (absent)/not identified Perineural invasion (PNI): Absent Microsatellite instability (MSI): Stable   01/23/2021 Genetic Testing   Negative genetic testing:  No pathogenic variants detected on the [employer] CancerNext-Expanded + RNAinsight panel. Two variants of uncertain significance (VUS) were detected - one in the ALK gene called p.R1212H and a second in the APC gene called 5'UTR_EX4dup. The report date is 01/23/2021.  The CancerNext-Expanded + RNAinsight gene panel offered by [employer] and includes sequencing and rearrangement analysis for the following 77 genes: AIP, ALK, APC, ATM, AXIN2, BAP1, BARD1, BLM, BMPR1A, BRCA1, BRCA2, BRIP1, CDC73, CDH1, CDK4, CDKN1B, CDKN2A, CHEK2, CTNNA1, DICER1, FANCC, FH, FLCN, GALNT12, KIF1B, LZTR1, MAX, MEN1, MET, MLH1, MSH2, MSH3, MSH6, MUTYH, NBN, NF1, NF2, NTHL1, PALB2, PHOX2B, PMS2, POT1, PRKAR1A, PTCH1, PTEN, RAD51C, RAD51D, RB1, RECQL, RET, SDHA, SDHAF2, SDHB, SDHC, SDHD, SMAD4, SMARCA4, SMARCB1, SMARCE1, STK11, SUFU, TMEM127, TP53, TSC1, TSC2, VHL and XRCC2 (sequencing and deletion/duplication); EGFR, EGLN1, HOXB13, KIT, MITF, PDGFRA, POLD1 and POLE (sequencing only); EPCAM and GREM1 (deletion/duplication only). RNA data is routinely analyzed for use in variant interpretation for all genes.    12/04/2021 Imaging   EXAM: CT CHEST, ABDOMEN, AND PELVIS WITH CONTRAST  IMPRESSION: 1. No evidence  of local recurrence or metastatic disease within the chest, abdomen, or pelvis. 2. Large  colonic stool burden.      CURRENT THERAPY: Surveillance   INTERVAL HISTORY Mr. Gerring returns for follow up as scheduled. Last seen by me 12/24/22.   ROS   Past Medical History:  Diagnosis Date   Colon cancer (HCC) 11/26/2020   Family history of lung cancer      Past Surgical History:  Procedure Laterality Date   COLONOSCOPY WITH PROPOFOL N/A 11/26/2020   Procedure: COLONOSCOPY WITH PROPOFOL;  Surgeon: Sherrilyn Rist, MD;  Location: [facility];  Service: Gastroenterology;  Laterality: N/A;   COLOSTOMY N/A 11/30/2020   Procedure: sigmoidectomy;  Surgeon: Luretha Murphy, MD;  Location: [facility];  Service: General;  Laterality: N/A;   SUBMUCOSAL TATTOO INJECTION  11/26/2020   Procedure: SUBMUCOSAL TATTOO INJECTION;  Surgeon: Sherrilyn Rist, MD;  Location: [facility];  Service: Gastroenterology;;   WISDOM TOOTH EXTRACTION       Outpatient Encounter Medications as of 07/15/2023  Medication Sig   Cholecalciferol 1.25 MG (50000 UT) capsule Take 1 capsule (50,000 Units total) by mouth once a week.   Melatonin 5 MG CHEW Chew 10 mg by mouth at bedtime.   No facility-administered encounter medications on file as of 07/15/2023.     There were no vitals filed for this visit. There is no height or weight on file to calculate BMI.   PHYSICAL EXAM GENERAL:alert, no distress and comfortable SKIN: no rash  EYES: sclera clear NECK: without mass LYMPH:  no palpable cervical or supraclavicular lymphadenopathy  LUNGS: clear with normal breathing effort HEART: regular rate & rhythm, no lower extremity edema ABDOMEN: abdomen soft, non-tender and normal bowel sounds NEURO: alert & oriented x 3 with fluent speech, no focal motor/sensory deficits Breast exam:  PAC without erythema    CBC    Component Value Date/Time   WBC 4.6 12/17/2022 0824   WBC 8.4 12/02/2020 0548   RBC 4.72 12/17/2022 0824   HGB 15.6 12/17/2022 0824   HCT 44.7 12/17/2022 0824   PLT 160 12/17/2022 0824   MCV  94.7 12/17/2022 0824   MCH 33.1 12/17/2022 0824   MCHC 34.9 12/17/2022 0824   RDW 11.9 12/17/2022 0824   LYMPHSABS 2.0 12/17/2022 0824   MONOABS 0.4 12/17/2022 0824   EOSABS 0.1 12/17/2022 0824   BASOSABS 0.0 12/17/2022 0824     CMP     Component Value Date/Time   NA 140 12/17/2022 0824   K 4.2 12/17/2022 0824   CL 105 12/17/2022 0824   CO2 30 12/17/2022 0824   GLUCOSE 87 12/17/2022 0824   BUN 16 12/17/2022 0824   CREATININE 1.11 12/17/2022 0824   CALCIUM 9.4 12/17/2022 0824   PROT 6.7 12/17/2022 0824   ALBUMIN 4.6 12/17/2022 0824   AST 19 12/17/2022 0824   ALT 18 12/17/2022 0824   ALKPHOS 50 12/17/2022 0824   BILITOT 1.1 12/17/2022 0824   GFRNONAA >60 12/17/2022 0824     ASSESSMENT & PLAN:37 year old male with no significant PMH or family history   1.  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  Diagnosed 11/2020, s/p sigmoid colectomy by Dr. Daphine Deutscher.  -No high risk features on path, adjuvant chemo was not recommended.  -He began surveillance and vitamin D.  -Serial ctDNA not detected -Surveillance colonoscopy 01/2022 with tubular adenoma, 3 year recall  -surveillance scan from 12/17/2022 NED.  Remains in  clinical remission -Guardant reveal 06/24/23 was not detected   2. Genetics -He has no prior personal or family history of cancer -path shows MMR normal and MSI-stable, this is not likely lynch syndrome related CRC -due to his young age at diagnosis, he underwent genetic testing which showed no pathogenic mutations, only VUS in ALK and APC genes -Patient has no children.  Siblings have been screened     PLAN:  No orders of the defined types were placed in this encounter.     All questions were answered. The patient knows to call the clinic with any problems, questions or concerns. No barriers to learning were detected. I spent *** counseling the patient face to face. The total  time spent in the appointment was *** and more than 50% was on counseling, review of test results, and coordination of care.   Santiago Glad, NP-C @DATE @

## 2023-07-15 ENCOUNTER — Telehealth: Payer: Self-pay | Admitting: Nurse Practitioner

## 2023-07-15 ENCOUNTER — Inpatient Hospital Stay: Payer: Federal, State, Local not specified - PPO | Admitting: Nurse Practitioner

## 2023-07-20 NOTE — Progress Notes (Unsigned)
Patient Care Team: Henry Grandchild, MD as PCP - General (Internal Medicine) Henry Mood, MD as Consulting Physician (Oncology) Henry Samples, NP as Nurse Practitioner (Nurse Practitioner)   CHIEF COMPLAINT: Follow up sigmoid colon cancer   Oncology History Overview Note   Cancer Staging  Cancer of sigmoid colon [facility]) Staging form: Colon and Rectum, AJCC 8th Edition - Pathologic stage from 11/30/2020: Stage IIA (pT3, pN0, cM0) - Signed by Henry Samples, NP on 12/12/2020    Cancer of sigmoid colon (HCC)  11/24/2020 Miscellaneous   FOBT+ in ED - admitted for further work up    11/24/2020 Imaging   CT AP with contrast IMPRESSION: Normal examination.   11/25/2020 Imaging   CTA abdomen/pelvis IMPRESSION: VASCULAR No acute vascular abnormality is noted. No findings to suggest active GI hemorrhage are seen. NON-VASCULAR No acute abnormality noted.  Unchanged from the previous day.   11/26/2020 Procedure   Colonoscopy, Dr. Amada Jupiter  A fungating and infiltrative partially obstructing mass was found in the distal sigmoid colon, with the distal aspect about 18-20cm from the anal verge when insufflated. The mass was partially circumferential (involving one-half of the lumen circumference), and the adult colonoscope passed easily. The mass measured about four cm in length. Oozing was present due to friable tissue with scope passage. There was also high grade stiagmata of a visible vessel within the mass that was the source of recent brisk bleeding.   11/26/2020 Tumor Marker   Baseline CEA: 0.9   11/30/2020 Definitive Surgery   Lap assisted sigmoid colectomy by Dr. Luretha Wall   11/30/2020 Pathology Results   FINAL MICROSCOPIC DIAGNOSIS:  A. COLON, SIGMOID, RESECTION:  -  Adenocarcinoma, moderately differentiated, 3 cm  -  No carcinoma identified in twenty-seven lymph nodes (0/27)  -  Margins uninvolved by carcinoma  Perforation-not identified Lymphovascular invasion-not  identified Perineural invasion-not identified PT3PN0 MMR normal, MSI-stable   11/30/2020 Initial Diagnosis   Malignant tumor of colon (HCC)   11/30/2020 Cancer Staging   Staging form: Colon and Rectum, AJCC 8th Edition - Pathologic stage from 11/30/2020: Stage IIA (pT3, pN0, cM0) - Signed by Henry Samples, NP on 12/12/2020 Total positive nodes: 0 Histologic grading system: 4 grade system Histologic grade (G): G2 Laterality: Left Lymph-vascular invasion (LVI): LVI not present (absent)/not identified Perineural invasion (PNI): Absent Microsatellite instability (MSI): Stable   01/23/2021 Genetic Testing   Negative genetic testing:  No pathogenic variants detected on the [employer] CancerNext-Expanded + RNAinsight panel. Two variants of uncertain significance (VUS) were detected - one in the ALK gene called p.R1212H and a second in the APC gene called 5'UTR_EX4dup. The report date is 01/23/2021.  The CancerNext-Expanded + RNAinsight gene panel offered by [employer] and includes sequencing and rearrangement analysis for the following 77 genes: AIP, ALK, APC, ATM, AXIN2, BAP1, BARD1, BLM, BMPR1A, BRCA1, BRCA2, BRIP1, CDC73, CDH1, CDK4, CDKN1B, CDKN2A, CHEK2, CTNNA1, DICER1, FANCC, FH, FLCN, GALNT12, KIF1B, LZTR1, MAX, MEN1, MET, MLH1, MSH2, MSH3, MSH6, MUTYH, NBN, NF1, NF2, NTHL1, PALB2, PHOX2B, PMS2, POT1, PRKAR1A, PTCH1, PTEN, RAD51C, RAD51D, RB1, RECQL, RET, SDHA, SDHAF2, SDHB, SDHC, SDHD, SMAD4, SMARCA4, SMARCB1, SMARCE1, STK11, SUFU, TMEM127, TP53, TSC1, TSC2, VHL and XRCC2 (sequencing and deletion/duplication); EGFR, EGLN1, HOXB13, KIT, MITF, PDGFRA, POLD1 and POLE (sequencing only); EPCAM and GREM1 (deletion/duplication only). RNA data is routinely analyzed for use in variant interpretation for all genes.    12/04/2021 Imaging   EXAM: CT CHEST, ABDOMEN, AND PELVIS WITH CONTRAST  IMPRESSION: 1. No evidence  of local recurrence or metastatic disease within the chest, abdomen, or pelvis. 2. Large  colonic stool burden.      CURRENT THERAPY: Surveillance   INTERVAL HISTORY [name] returns for follow up as scheduled. Last seen by me 12/24/22. Recent guardant reveal was negative on 06/24/23.  Doing very well in the interim, feels normal.  Energy and appetite are good. Denies unintentional weight loss, change in bowel habits, abdominal pain/bloating, black or bloody stools, or any other specific complaints.  ROS  All other systems reviewed and negative  Past Medical History:  Diagnosis Date   Colon cancer (HCC) 11/26/2020   Family history of lung cancer      Past Surgical History:  Procedure Laterality Date   COLONOSCOPY WITH PROPOFOL N/A 11/26/2020   Procedure: COLONOSCOPY WITH PROPOFOL;  Surgeon: Henry Rist, MD;  Location: [facility];  Service: Gastroenterology;  Laterality: N/A;   COLOSTOMY N/A 11/30/2020   Procedure: sigmoidectomy;  Surgeon: Henry Murphy, MD;  Location: [facility];  Service: General;  Laterality: N/A;   SUBMUCOSAL TATTOO INJECTION  11/26/2020   Procedure: SUBMUCOSAL TATTOO INJECTION;  Surgeon: Henry Rist, MD;  Location: [facility];  Service: Gastroenterology;;   WISDOM TOOTH EXTRACTION       Outpatient Encounter Medications as of 07/23/2023  Medication Sig   Cholecalciferol 1.25 MG (50000 UT) capsule Take 1 capsule (50,000 Units total) by mouth once a week.   Melatonin 5 MG CHEW Chew 10 mg by mouth at bedtime.   No facility-administered encounter medications on file as of 07/23/2023.     Today's Vitals   07/23/23 0941 07/23/23 0944  BP: 124/71   Pulse: 70   Resp: 19   Temp: 98.3 F (36.8 C)   TempSrc: Oral   SpO2: 100%   Weight: 172 lb 4.8 oz (78.2 kg)   PainSc:  0-No pain   Body mass index is 25.44 kg/m.   PHYSICAL EXAM GENERAL:alert, no distress and comfortable SKIN: no rash  EYES: sclera clear NECK: without mass LYMPH:  no palpable cervical or supraclavicular lymphadenopathy  LUNGS: clear with normal breathing  effort HEART: regular rate & rhythm, no lower extremity edema ABDOMEN: abdomen soft, non-tender and normal bowel sounds NEURO: alert & oriented x 3 with fluent speech, no focal motor/sensory deficits   CBC    Component Value Date/Time   WBC 4.6 12/17/2022 0824   WBC 8.4 12/02/2020 0548   RBC 4.72 12/17/2022 0824   HGB 15.6 12/17/2022 0824   HCT 44.7 12/17/2022 0824   PLT 160 12/17/2022 0824   MCV 94.7 12/17/2022 0824   MCH 33.1 12/17/2022 0824   MCHC 34.9 12/17/2022 0824   RDW 11.9 12/17/2022 0824   LYMPHSABS 2.0 12/17/2022 0824   MONOABS 0.4 12/17/2022 0824   EOSABS 0.1 12/17/2022 0824   BASOSABS 0.0 12/17/2022 0824     CMP     Component Value Date/Time   NA 140 12/17/2022 0824   K 4.2 12/17/2022 0824   CL 105 12/17/2022 0824   CO2 30 12/17/2022 0824   GLUCOSE 87 12/17/2022 0824   BUN 16 12/17/2022 0824   CREATININE 1.11 12/17/2022 0824   CALCIUM 9.4 12/17/2022 0824   PROT 6.7 12/17/2022 0824   ALBUMIN 4.6 12/17/2022 0824   AST 19 12/17/2022 0824   ALT 18 12/17/2022 0824   ALKPHOS 50 12/17/2022 0824   BILITOT 1.1 12/17/2022 0824   GFRNONAA >60 12/17/2022 0824     ASSESSMENT & PLAN:37 year old male with no  significant PMH or family history   1.  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  Diagnosed 11/2020, s/p sigmoid colectomy by Dr. Daphine Deutscher.  -No high risk features on path, adjuvant chemo was not recommended.  -He began surveillance and vitamin D.  -Serial ctDNA not detected, most recent 06/24/2023 -Surveillance colonoscopy 01/2022 with tubular adenoma, 3 year recall  -surveillance scan from 12/17/2022 NED.  -Mr. Bond is clinically doing well.  Exam is benign, labs are pending.  Overall no concern for recurrence, remains in clinical remission -Continue surveillance. -Lab, including guardant reveal/ctDNA, and surveillance CT in 11/2023, then f/up a week later   2. Genetics -He  has no prior personal or family history of cancer -path shows MMR normal and MSI-stable, this is not likely lynch syndrome related CRC -due to his young age at diagnosis, he underwent genetic testing which showed no pathogenic mutations, only VUS in ALK and APC genes -Patient has no children.  Siblings have been screened     PLAN: -Recent Guardant Reveal reviewed, not detected -Continue colon cancer surveillance  -Lab today, will call with results  -Lab (CBC, CMP, CEA, Guardant Reveal) and surveillance CT 12/02/23, f/up a week later  Orders Placed This Encounter  Procedures   CT ABDOMEN PELVIS W CONTRAST    Standing Status:   Future    Standing Expiration Date:   07/22/2024    Order Specific Question:   If indicated for the ordered procedure, I authorize the administration of contrast media per Radiology protocol    Answer:   Yes    Order Specific Question:   Does the patient have a contrast media/X-ray dye allergy?    Answer:   No    Order Specific Question:   Preferred imaging location?    Answer:   [facility]    Order Specific Question:   If indicated for the ordered procedure, I authorize the administration of oral contrast media per Radiology protocol    Answer:   Yes   CBC with Differential (Cancer Center Only)    Standing Status:   Future    Standing Expiration Date:   07/22/2024   CEA (Access)-[facility] ONLY    Standing Status:   Future    Standing Expiration Date:   07/22/2024   CMP (Cancer Center only)    Standing Status:   Future    Standing Expiration Date:   07/22/2024   Guardant Reveal    Standing Status:   Future    Standing Expiration Date:   07/22/2024      All questions were answered. The patient knows to call the clinic with any problems, questions or concerns. No barriers to learning were detected.   Santiago Glad, NP-C 07/23/2023

## 2023-07-23 ENCOUNTER — Inpatient Hospital Stay: Payer: Federal, State, Local not specified - PPO

## 2023-07-23 ENCOUNTER — Inpatient Hospital Stay: Payer: Federal, State, Local not specified - PPO | Attending: Hematology | Admitting: Nurse Practitioner

## 2023-07-23 ENCOUNTER — Encounter: Payer: Self-pay | Admitting: Nurse Practitioner

## 2023-07-23 VITALS — BP 124/71 | HR 70 | Temp 98.3°F | Resp 19 | Wt 172.3 lb

## 2023-07-23 DIAGNOSIS — Z85038 Personal history of other malignant neoplasm of large intestine: Secondary | ICD-10-CM | POA: Insufficient documentation

## 2023-07-23 DIAGNOSIS — C187 Malignant neoplasm of sigmoid colon: Secondary | ICD-10-CM | POA: Diagnosis not present

## 2023-07-23 DIAGNOSIS — Z08 Encounter for follow-up examination after completed treatment for malignant neoplasm: Secondary | ICD-10-CM | POA: Insufficient documentation

## 2023-07-23 LAB — CBC WITH DIFFERENTIAL (CANCER CENTER ONLY)
Abs Immature Granulocytes: 0.02 10*3/uL (ref 0.00–0.07)
Basophils Absolute: 0 10*3/uL (ref 0.0–0.1)
Basophils Relative: 0 %
Eosinophils Absolute: 0.1 10*3/uL (ref 0.0–0.5)
Eosinophils Relative: 1 %
HCT: 41.9 % (ref 39.0–52.0)
Hemoglobin: 14.9 g/dL (ref 13.0–17.0)
Immature Granulocytes: 0 %
Lymphocytes Relative: 13 %
Lymphs Abs: 1 10*3/uL (ref 0.7–4.0)
MCH: 33.6 pg (ref 26.0–34.0)
MCHC: 35.6 g/dL (ref 30.0–36.0)
MCV: 94.4 fL (ref 80.0–100.0)
Monocytes Absolute: 0.6 10*3/uL (ref 0.1–1.0)
Monocytes Relative: 8 %
Neutro Abs: 5.9 10*3/uL (ref 1.7–7.7)
Neutrophils Relative %: 78 %
Platelet Count: 149 10*3/uL — ABNORMAL LOW (ref 150–400)
RBC: 4.44 MIL/uL (ref 4.22–5.81)
RDW: 11.8 % (ref 11.5–15.5)
Smear Review: NORMAL
WBC Count: 7.6 10*3/uL (ref 4.0–10.5)
nRBC: 0 % (ref 0.0–0.2)

## 2023-07-23 LAB — CMP (CANCER CENTER ONLY)
ALT: 17 U/L (ref 0–44)
AST: 18 U/L (ref 15–41)
Albumin: 4.7 g/dL (ref 3.5–5.0)
Alkaline Phosphatase: 51 U/L (ref 38–126)
Anion gap: 4 — ABNORMAL LOW (ref 5–15)
BUN: 14 mg/dL (ref 6–20)
CO2: 30 mmol/L (ref 22–32)
Calcium: 9.5 mg/dL (ref 8.9–10.3)
Chloride: 106 mmol/L (ref 98–111)
Creatinine: 0.95 mg/dL (ref 0.61–1.24)
GFR, Estimated: >60 mL/min (ref >60)
Glucose, Bld: 89 mg/dL (ref 70–99)
Potassium: 4.4 mmol/L (ref 3.5–5.1)
Sodium: 140 mmol/L (ref 135–145)
Total Bilirubin: 1.3 mg/dL — ABNORMAL HIGH (ref 0.3–1.2)
Total Protein: 6.9 g/dL (ref 6.5–8.1)

## 2023-07-23 LAB — CEA (ACCESS): CEA (CHCC): 1.41 ng/mL (ref 0.00–5.00)

## 2023-11-19 ENCOUNTER — Ambulatory Visit (HOSPITAL_COMMUNITY): Payer: Federal, State, Local not specified - PPO

## 2023-11-25 ENCOUNTER — Ambulatory Visit: Payer: Federal, State, Local not specified - PPO | Admitting: Internal Medicine

## 2023-11-25 ENCOUNTER — Encounter: Payer: Self-pay | Admitting: Internal Medicine

## 2023-11-25 VITALS — BP 128/84 | HR 64 | Temp 98.0°F | Ht 69.0 in | Wt 170.2 lb

## 2023-11-25 DIAGNOSIS — Z302 Encounter for sterilization: Secondary | ICD-10-CM | POA: Insufficient documentation

## 2023-11-25 DIAGNOSIS — F4322 Adjustment disorder with anxiety: Secondary | ICD-10-CM | POA: Insufficient documentation

## 2023-11-25 DIAGNOSIS — Z0000 Encounter for general adult medical examination without abnormal findings: Secondary | ICD-10-CM | POA: Diagnosis not present

## 2023-11-25 DIAGNOSIS — H527 Unspecified disorder of refraction: Secondary | ICD-10-CM | POA: Insufficient documentation

## 2023-11-25 DIAGNOSIS — M25569 Pain in unspecified knee: Secondary | ICD-10-CM | POA: Insufficient documentation

## 2023-11-25 DIAGNOSIS — Z23 Encounter for immunization: Secondary | ICD-10-CM | POA: Insufficient documentation

## 2023-11-25 NOTE — Patient Instructions (Signed)
 Health Maintenance, Male  Adopting a healthy lifestyle and getting preventive care are important in promoting health and wellness. Ask your health care provider about:  The right schedule for you to have regular tests and exams.  Things you can do on your own to prevent diseases and keep yourself healthy.  What should I know about diet, weight, and exercise?  Eat a healthy diet    Eat a diet that includes plenty of vegetables, fruits, low-fat dairy products, and lean protein.  Do not eat a lot of foods that are high in solid fats, added sugars, or sodium.  Maintain a healthy weight  Body mass index (BMI) is a measurement that can be used to identify possible weight problems. It estimates body fat based on height and weight. Your health care provider can help determine your BMI and help you achieve or maintain a healthy weight.  Get regular exercise  Get regular exercise. This is one of the most important things you can do for your health. Most adults should:  Exercise for at least 150 minutes each week. The exercise should increase your heart rate and make you sweat (moderate-intensity exercise).  Do strengthening exercises at least twice a week. This is in addition to the moderate-intensity exercise.  Spend less time sitting. Even light physical activity can be beneficial.  Watch cholesterol and blood lipids  Have your blood tested for lipids and cholesterol at 40 years of age, then have this test every 5 years.  You may need to have your cholesterol levels checked more often if:  Your lipid or cholesterol levels are high.  You are older than 40 years of age.  You are at high risk for heart disease.  What should I know about cancer screening?  Many types of cancers can be detected early and may often be prevented. Depending on your health history and family history, you may need to have cancer screening at various ages. This may include screening for:  Colorectal cancer.  Prostate cancer.  Skin cancer.  Lung  cancer.  What should I know about heart disease, diabetes, and high blood pressure?  Blood pressure and heart disease  High blood pressure causes heart disease and increases the risk of stroke. This is more likely to develop in people who have high blood pressure readings or are overweight.  Talk with your health care provider about your target blood pressure readings.  Have your blood pressure checked:  Every 3-5 years if you are 9-95 years of age.  Every year if you are 85 years old or older.  If you are between the ages of 29 and 29 and are a current or former smoker, ask your health care provider if you should have a one-time screening for abdominal aortic aneurysm (AAA).  Diabetes  Have regular diabetes screenings. This checks your fasting blood sugar level. Have the screening done:  Once every three years after age 23 if you are at a normal weight and have a low risk for diabetes.  More often and at a younger age if you are overweight or have a high risk for diabetes.  What should I know about preventing infection?  Hepatitis B  If you have a higher risk for hepatitis B, you should be screened for this virus. Talk with your health care provider to find out if you are at risk for hepatitis B infection.  Hepatitis C  Blood testing is recommended for:  Everyone born from [birth year] through 1967.  Anyone  with known risk factors for hepatitis C.  Sexually transmitted infections (STIs)  You should be screened each year for STIs, including gonorrhea and chlamydia, if:  You are sexually active and are younger than 40 years of age.  You are older than 40 years of age and your health care provider tells you that you are at risk for this type of infection.  Your sexual activity has changed since you were last screened, and you are at increased risk for chlamydia or gonorrhea. Ask your health care provider if you are at risk.  Ask your health care provider about whether you are at high risk for HIV. Your health care provider  may recommend a prescription medicine to help prevent HIV infection. If you choose to take medicine to prevent HIV, you should first get tested for HIV. You should then be tested every 3 months for as long as you are taking the medicine.  Follow these instructions at home:  Alcohol use  Do not drink alcohol if your health care provider tells you not to drink.  If you drink alcohol:  Limit how much you have to 0-2 drinks a day.  Know how much alcohol is in your drink. In the [place] one drink equals one 12 oz bottle of beer (355 mL), one 5 oz glass of wine (148 mL), or one 1 oz glass of hard liquor (44 mL).  Lifestyle  Do not use any products that contain nicotine or tobacco. These products include cigarettes, chewing tobacco, and vaping devices, such as e-cigarettes. If you need help quitting, ask your health care provider.  Do not use street drugs.  Do not share needles.  Ask your health care provider for help if you need support or information about quitting drugs.  General instructions  Schedule regular health, dental, and eye exams.  Stay current with your vaccines.  Tell your health care provider if:  You often feel depressed.  You have ever been abused or do not feel safe at home.  Summary  Adopting a healthy lifestyle and getting preventive care are important in promoting health and wellness.  Follow your health care provider's instructions about healthy diet, exercising, and getting tested or screened for diseases.  Follow your health care provider's instructions on monitoring your cholesterol and blood pressure.  This information is not intended to replace advice given to you by your health care provider. Make sure you discuss any questions you have with your health care provider.  Document Revised: 01/30/2021 Document Reviewed: 01/30/2021  Elsevier Patient Education  2024 [employer].

## 2023-11-25 NOTE — Progress Notes (Signed)
 Subjective:  Patient ID: Henry Wall, male    DOB: 09/27/1985  Age: 38 y.o. MRN: [id number]  CC: Annual Exam   HPI Henry Wall presents for a CPX.  Discussed the use of AI scribe software for clinical note transcription with the patient, who gave verbal consent to proceed.  History of Present Illness   Henry Wall is a 38 year old male who presents for a vasectomy consultation.  He is inquiring about the referral process to a urologist for the procedure.  He maintains good health with regular physical activity and no current health concerns. All vaccinations are up to date, including the flu vaccine. He had a colonoscopy a year and a half ago with no concerns and has a CT scan scheduled for tomorrow.  He is currently taking melatonin and vitamin D supplements. Sleep is described as 'okay'.       Outpatient Medications Prior to Visit  Medication Sig Dispense Refill   Cholecalciferol 1.25 MG (50000 UT) capsule Take 1 capsule (50,000 Units total) by mouth once a week. 12 capsule 1   Melatonin 5 MG CHEW Chew 10 mg by mouth at bedtime.     No facility-administered medications prior to visit.    ROS Review of Systems  Constitutional: Negative.  Negative for diaphoresis and fatigue.  HENT: Negative.    Eyes: Negative.   Respiratory:  Negative for cough and shortness of breath.   Cardiovascular:  Negative for chest pain, palpitations and leg swelling.  Gastrointestinal: Negative.  Negative for abdominal pain, constipation, diarrhea, nausea and vomiting.  Endocrine: Negative.   Genitourinary: Negative.  Negative for difficulty urinating.  Musculoskeletal: Negative.  Negative for arthralgias and myalgias.  Skin: Negative.   Neurological: Negative.  Negative for dizziness.  Hematological:  Negative for adenopathy. Does not bruise/bleed easily.  Psychiatric/Behavioral:  Positive for sleep disturbance.   All other systems reviewed and are negative.   Objective:  BP 128/84 (BP  Location: Left Arm, Patient Position: Sitting, Cuff Size: Normal)   Pulse 64   Temp 98 F (36.7 C) (Oral)   Ht 5\' 9"  (1.753 m)   Wt 170 lb 3.2 oz (77.2 kg)   SpO2 99%   BMI 25.13 kg/m   BP Readings from Last 3 Encounters:  11/25/23 128/84  07/23/23 124/71  12/24/22 128/71    Wt Readings from Last 3 Encounters:  11/25/23 170 lb 3.2 oz (77.2 kg)  07/23/23 172 lb 4.8 oz (78.2 kg)  12/24/22 168 lb 14.4 oz (76.6 kg)    Physical Exam Vitals reviewed.  HENT:     Nose: Nose normal.     Mouth/Throat:     Mouth: Mucous membranes are moist.  Eyes:     General: No scleral icterus.    Conjunctiva/sclera: Conjunctivae normal.  Cardiovascular:     Rate and Rhythm: Normal rate and regular rhythm.     Heart sounds: No murmur heard. Pulmonary:     Effort: Pulmonary effort is normal.     Breath sounds: No stridor. No wheezing, rhonchi or rales.  Abdominal:     General: Abdomen is flat.     Palpations: There is no mass.     Tenderness: There is no abdominal tenderness. There is no guarding.     Hernia: No hernia is present.  Musculoskeletal:        General: Normal range of motion.     Cervical back: Neck supple.     Right lower leg: No edema.  Left lower leg: No edema.  Lymphadenopathy:     Cervical: No cervical adenopathy.  Skin:    General: Skin is warm and dry.  Neurological:     General: No focal deficit present.     Mental Status: He is alert.  Psychiatric:        Mood and Affect: Mood normal.        Behavior: Behavior normal.        Thought Content: Thought content normal.        Judgment: Judgment normal.     Lab Results  Component Value Date   WBC 7.6 07/23/2023   HGB 14.9 07/23/2023   HCT 41.9 07/23/2023   PLT 149 (L) 07/23/2023   GLUCOSE 89 07/23/2023   CHOL 176 10/18/2020   TRIG 45.0 10/18/2020   HDL 74.80 10/18/2020   LDLCALC 93 10/18/2020   ALT 17 07/23/2023   AST 18 07/23/2023   NA 140 07/23/2023   K 4.4 07/23/2023   CL 106 07/23/2023    CREATININE 0.95 07/23/2023   BUN 14 07/23/2023   CO2 30 07/23/2023   TSH 1.28 10/18/2020   INR 1.1 11/25/2020    CT CHEST ABDOMEN PELVIS W CONTRAST Result Date: 12/17/2022 CLINICAL DATA:  Colon cancer stage II/III, monitor. * Tracking Code: BO * EXAM: CT CHEST, ABDOMEN, AND PELVIS WITH CONTRAST TECHNIQUE: Multidetector CT imaging of the chest, abdomen and pelvis was performed following the standard protocol during bolus administration of intravenous contrast. RADIATION DOSE REDUCTION: This exam was performed according to the departmental dose-optimization program which includes automated exposure control, adjustment of the mA and/or kV according to patient size and/or use of iterative reconstruction technique. CONTRAST:  OMNIPAQUE IOHEXOL 300 MG/ML  SOLN COMPARISON:  Multiple priors including most recent CT December 04, 2021 FINDINGS: CT CHEST FINDINGS Cardiovascular: Normal caliber thoracic aorta. No central pulmonary embolus on this nondedicated study. Normal size heart. No significant pericardial effusion/thickening. Mediastinum/Nodes: No suspicious thyroid nodule. No pathologically enlarged mediastinal, hilar or axillary lymph nodes. The esophagus is grossly unremarkable. Lungs/Pleura: Minimal biapical pleuroparenchymal scarring. No suspicious pulmonary nodules or masses. No pleural effusion. No pneumothorax. Musculoskeletal: Gynecomastia bilaterally. No aggressive lytic or blastic lesion of bone. CT ABDOMEN PELVIS FINDINGS Hepatobiliary: No suspicious hepatic lesion. Gallbladder is unremarkable. No biliary ductal dilation. Pancreas: No pancreatic ductal dilation or evidence of acute inflammation. Spleen: No splenomegaly. Adrenals/Urinary Tract: Bilateral adrenal glands appear normal. No hydronephrosis. Kidneys demonstrate symmetric enhancement. Urinary bladder is unremarkable for degree of distension. Stomach/Bowel: Radiopaque enteric contrast material traverses the splenic flexure. Stomach is  minimally distended limiting evaluation. No pathologic dilation of small or large bowel. Large colonic stool burden. Prior partial sigmoidectomy with anastomotic sutures in the pelvis. No suspicious nodularity along the suture line. Vascular/Lymphatic: Normal caliber abdominal aorta. Smooth IVC contours. No pathologically enlarged abdominal or pelvic lymph nodes. Reproductive: Prostate is unremarkable. Other: No significant abdominopelvic free fluid. No discrete peritoneal or omental nodularity. Musculoskeletal: No aggressive lytic or blastic lesion of bone. IMPRESSION: 1. Prior partial sigmoidectomy without evidence of local recurrence. 2. No evidence of metastatic disease within the chest, abdomen, or pelvis. 3. Large colonic stool burden. Electronically Signed   By: Maudry Mayhew M.D.   On: 12/17/2022 17:49    Assessment & Plan:  Need for immunization against influenza -     Flu vaccine trivalent PF, 6mos and older(Flulaval,Afluria,Fluarix,Fluzone)  Encounter for sterilization in male -     Ambulatory referral to Urology  Routine general medical examination at a  health care facility- Exam completed, no labs indicated, vaccines reviewed and updated, no cancer screenings indicated, pt ed material was given.      Follow-up: Return in about 1 year (around 11/24/2024).  Sanda Linger, MD

## 2023-11-26 ENCOUNTER — Ambulatory Visit (HOSPITAL_COMMUNITY)
Admission: RE | Admit: 2023-11-26 | Discharge: 2023-11-26 | Disposition: A | Discharge status: Home / Self Care | Payer: Federal, State, Local not specified - PPO | Source: Ambulatory Visit | Attending: Nurse Practitioner | Admitting: Nurse Practitioner

## 2023-11-26 ENCOUNTER — Other Ambulatory Visit: Payer: Self-pay

## 2023-11-26 DIAGNOSIS — C187 Malignant neoplasm of sigmoid colon: Secondary | ICD-10-CM | POA: Insufficient documentation

## 2023-11-26 DIAGNOSIS — C189 Malignant neoplasm of colon, unspecified: Secondary | ICD-10-CM | POA: Diagnosis not present

## 2023-11-26 MED ORDER — IOHEXOL 300 MG/ML  SOLN
100.0000 mL | Freq: Once | INTRAMUSCULAR | Status: AC | PRN
  Administered 2023-11-26: 100 mL via INTRAVENOUS

## 2023-11-26 MED ORDER — IOHEXOL 300 MG/ML  SOLN
30.0000 mL | Freq: Once | INTRAMUSCULAR | Status: AC | PRN
  Administered 2023-11-26: 30 mL via ORAL

## 2023-12-02 ENCOUNTER — Inpatient Hospital Stay: Payer: Federal, State, Local not specified - PPO | Attending: Hematology

## 2023-12-02 DIAGNOSIS — Z9049 Acquired absence of other specified parts of digestive tract: Secondary | ICD-10-CM | POA: Insufficient documentation

## 2023-12-02 DIAGNOSIS — Z85038 Personal history of other malignant neoplasm of large intestine: Secondary | ICD-10-CM | POA: Insufficient documentation

## 2023-12-02 DIAGNOSIS — C187 Malignant neoplasm of sigmoid colon: Secondary | ICD-10-CM

## 2023-12-02 LAB — CBC WITH DIFFERENTIAL (CANCER CENTER ONLY)
Abs Immature Granulocytes: 0.01 10*3/uL (ref 0.00–0.07)
Basophils Absolute: 0 10*3/uL (ref 0.0–0.1)
Basophils Relative: 1 %
Eosinophils Absolute: 0.1 10*3/uL (ref 0.0–0.5)
Eosinophils Relative: 2 %
HCT: 43.9 % (ref 39.0–52.0)
Hemoglobin: 15 g/dL (ref 13.0–17.0)
Immature Granulocytes: 0 %
Lymphocytes Relative: 44 %
Lymphs Abs: 2.2 10*3/uL (ref 0.7–4.0)
MCH: 32.2 pg (ref 26.0–34.0)
MCHC: 34.2 g/dL (ref 30.0–36.0)
MCV: 94.2 fL (ref 80.0–100.0)
Monocytes Absolute: 0.4 10*3/uL (ref 0.1–1.0)
Monocytes Relative: 7 %
Neutro Abs: 2.2 10*3/uL (ref 1.7–7.7)
Neutrophils Relative %: 46 %
Platelet Count: 167 10*3/uL (ref 150–400)
RBC: 4.66 MIL/uL (ref 4.22–5.81)
RDW: 11.9 % (ref 11.5–15.5)
WBC Count: 4.9 10*3/uL (ref 4.0–10.5)
nRBC: 0 % (ref 0.0–0.2)

## 2023-12-02 LAB — CMP (CANCER CENTER ONLY)
ALT: 15 U/L (ref 0–44)
AST: 17 U/L (ref 15–41)
Albumin: 4.5 g/dL (ref 3.5–5.0)
Alkaline Phosphatase: 48 U/L (ref 38–126)
Anion gap: 5 (ref 5–15)
BUN: 12 mg/dL (ref 6–20)
CO2: 30 mmol/L (ref 22–32)
Calcium: 9.3 mg/dL (ref 8.9–10.3)
Chloride: 104 mmol/L (ref 98–111)
Creatinine: 1.01 mg/dL (ref 0.61–1.24)
GFR, Estimated: >60 mL/min (ref >60)
Glucose, Bld: 90 mg/dL (ref 70–99)
Potassium: 4.2 mmol/L (ref 3.5–5.1)
Sodium: 139 mmol/L (ref 135–145)
Total Bilirubin: 1.3 mg/dL — ABNORMAL HIGH (ref 0.0–1.2)
Total Protein: 6.6 g/dL (ref 6.5–8.1)

## 2023-12-02 LAB — CEA (ACCESS): CEA (CHCC): 1.23 ng/mL (ref 0.00–5.00)

## 2023-12-08 NOTE — Progress Notes (Unsigned)
 Patient Care Team: Etta Grandchild, MD as PCP - General (Internal Medicine) Malachy Mood, MD as Consulting Physician (Oncology) Pollyann Samples, NP as Nurse Practitioner (Nurse Practitioner)   CHIEF COMPLAINT: Follow up sigmoid colon cancer   Oncology History Overview Note   Cancer Staging  Cancer of sigmoid colon [facility]) Staging form: Colon and Rectum, AJCC 8th Edition - Pathologic stage from 11/30/2020: Stage IIA (pT3, pN0, cM0) - Signed by Pollyann Samples, NP on 12/12/2020    Cancer of sigmoid colon (HCC)  11/24/2020 Miscellaneous   FOBT+ in ED - admitted for further work up    11/24/2020 Imaging   CT AP with contrast IMPRESSION: Normal examination.   11/25/2020 Imaging   CTA abdomen/pelvis IMPRESSION: VASCULAR No acute vascular abnormality is noted. No findings to suggest active GI hemorrhage are seen. NON-VASCULAR No acute abnormality noted.  Unchanged from the previous day.   11/26/2020 Procedure   Colonoscopy, Dr. Amada Jupiter  A fungating and infiltrative partially obstructing mass was found in the distal sigmoid colon, with the distal aspect about 18-20cm from the anal verge when insufflated. The mass was partially circumferential (involving one-half of the lumen circumference), and the adult colonoscope passed easily. The mass measured about four cm in length. Oozing was present due to friable tissue with scope passage. There was also high grade stiagmata of a visible vessel within the mass that was the source of recent brisk bleeding.   11/26/2020 Tumor Marker   Baseline CEA: 0.9   11/30/2020 Definitive Surgery   Lap assisted sigmoid colectomy by Dr. Luretha Murphy   11/30/2020 Pathology Results   FINAL MICROSCOPIC DIAGNOSIS:  A. COLON, SIGMOID, RESECTION:  -  Adenocarcinoma, moderately differentiated, 3 cm  -  No carcinoma identified in twenty-seven lymph nodes (0/27)  -  Margins uninvolved by carcinoma  Perforation-not identified Lymphovascular invasion-not  identified Perineural invasion-not identified PT3PN0 MMR normal, MSI-stable   11/30/2020 Initial Diagnosis   Malignant tumor of colon (HCC)   11/30/2020 Cancer Staging   Staging form: Colon and Rectum, AJCC 8th Edition - Pathologic stage from 11/30/2020: Stage IIA (pT3, pN0, cM0) - Signed by Pollyann Samples, NP on 12/12/2020 Total positive nodes: 0 Histologic grading system: 4 grade system Histologic grade (G): G2 Laterality: Left Lymph-vascular invasion (LVI): LVI not present (absent)/not identified Perineural invasion (PNI): Absent Microsatellite instability (MSI): Stable   01/23/2021 Genetic Testing   Negative genetic testing:  No pathogenic variants detected on the [employer] CancerNext-Expanded + RNAinsight panel. Two variants of uncertain significance (VUS) were detected - one in the ALK gene called p.R1212H and a second in the APC gene called 5'UTR_EX4dup. The report date is 01/23/2021.  The CancerNext-Expanded + RNAinsight gene panel offered by [employer] and includes sequencing and rearrangement analysis for the following 77 genes: AIP, ALK, APC, ATM, AXIN2, BAP1, BARD1, BLM, BMPR1A, BRCA1, BRCA2, BRIP1, CDC73, CDH1, CDK4, CDKN1B, CDKN2A, CHEK2, CTNNA1, DICER1, FANCC, FH, FLCN, GALNT12, KIF1B, LZTR1, MAX, MEN1, MET, MLH1, MSH2, MSH3, MSH6, MUTYH, NBN, NF1, NF2, NTHL1, PALB2, PHOX2B, PMS2, POT1, PRKAR1A, PTCH1, PTEN, RAD51C, RAD51D, RB1, RECQL, RET, SDHA, SDHAF2, SDHB, SDHC, SDHD, SMAD4, SMARCA4, SMARCB1, SMARCE1, STK11, SUFU, TMEM127, TP53, TSC1, TSC2, VHL and XRCC2 (sequencing and deletion/duplication); EGFR, EGLN1, HOXB13, KIT, MITF, PDGFRA, POLD1 and POLE (sequencing only); EPCAM and GREM1 (deletion/duplication only). RNA data is routinely analyzed for use in variant interpretation for all genes.    12/04/2021 Imaging   EXAM: CT CHEST, ABDOMEN, AND PELVIS WITH CONTRAST  IMPRESSION: 1. No evidence  of local recurrence or metastatic disease within the chest, abdomen, or pelvis. 2. Large  colonic stool burden.      CURRENT THERAPY: Surveillance   INTERVAL HISTORY Henry Wall returns for follow up as scheduled. Last seen by me 07/23/23.   ROS   Past Medical History:  Diagnosis Date   Colon cancer (HCC) 11/26/2020   Family history of lung cancer      Past Surgical History:  Procedure Laterality Date   COLONOSCOPY WITH PROPOFOL N/A 11/26/2020   Procedure: COLONOSCOPY WITH PROPOFOL;  Surgeon: Sherrilyn Rist, MD;  Location: [facility];  Service: Gastroenterology;  Laterality: N/A;   COLOSTOMY N/A 11/30/2020   Procedure: sigmoidectomy;  Surgeon: Luretha Murphy, MD;  Location: [facility] ORS;  Service: General;  Laterality: N/A;   SUBMUCOSAL TATTOO INJECTION  11/26/2020   Procedure: SUBMUCOSAL TATTOO INJECTION;  Surgeon: Sherrilyn Rist, MD;  Location: [facility];  Service: Gastroenterology;;   WISDOM TOOTH EXTRACTION       Outpatient Encounter Medications as of 12/09/2023  Medication Sig   Cholecalciferol 1.25 MG (50000 UT) capsule Take 1 capsule (50,000 Units total) by mouth once a week.   Melatonin 5 MG CHEW Chew 10 mg by mouth at bedtime.   No facility-administered encounter medications on file as of 12/09/2023.     There were no vitals filed for this visit. There is no height or weight on file to calculate BMI.   PHYSICAL EXAM GENERAL:alert, no distress and comfortable SKIN: no rash  EYES: sclera clear NECK: without mass LYMPH:  no palpable cervical or supraclavicular lymphadenopathy  LUNGS: clear with normal breathing effort HEART: regular rate & rhythm, no lower extremity edema ABDOMEN: abdomen soft, non-tender and normal bowel sounds NEURO: alert & oriented x 3 with fluent speech, no focal motor/sensory deficits Breast exam:  PAC without erythema    CBC    Component Value Date/Time   WBC 4.9 12/02/2023 1013   WBC 8.4 12/02/2020 0548   RBC 4.66 12/02/2023 1013   HGB 15.0 12/02/2023 1013   HCT 43.9 12/02/2023 1013   PLT 167 12/02/2023 1013   MCV  94.2 12/02/2023 1013   MCH 32.2 12/02/2023 1013   MCHC 34.2 12/02/2023 1013   RDW 11.9 12/02/2023 1013   LYMPHSABS 2.2 12/02/2023 1013   MONOABS 0.4 12/02/2023 1013   EOSABS 0.1 12/02/2023 1013   BASOSABS 0.0 12/02/2023 1013     CMP     Component Value Date/Time   NA 139 12/02/2023 1013   K 4.2 12/02/2023 1013   CL 104 12/02/2023 1013   CO2 30 12/02/2023 1013   GLUCOSE 90 12/02/2023 1013   BUN 12 12/02/2023 1013   CREATININE 1.01 12/02/2023 1013   CALCIUM 9.3 12/02/2023 1013   PROT 6.6 12/02/2023 1013   ALBUMIN 4.5 12/02/2023 1013   AST 17 12/02/2023 1013   ALT 15 12/02/2023 1013   ALKPHOS 48 12/02/2023 1013   BILITOT 1.3 (H) 12/02/2023 1013   GFRNONAA >60 12/02/2023 1013     ASSESSMENT & PLAN:38 year old male with no significant PMH or family history   1.  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  Diagnosed 11/2020, s/p sigmoid colectomy by Dr. Daphine Deutscher.  -No high risk features on path, adjuvant chemo was not recommended.  -He began surveillance and vitamin D.  -Serial ctDNA not detected, most recent 06/24/2023 -Surveillance colonoscopy 01/2022 with tubular adenoma, 3 year recall  -surveillance scan from 12/17/2022  NED.    2. Genetics -He has no prior personal or family history of cancer -path shows MMR normal and MSI-stable, this is not likely lynch syndrome related CRC -due to his young age at diagnosis, he underwent genetic testing which showed no pathogenic mutations, only VUS in ALK and APC genes -Patient has no children.  Siblings have been screened        PLAN:  No orders of the defined types were placed in this encounter.     All questions were answered. The patient knows to call the clinic with any problems, questions or concerns. No barriers to learning were detected. I spent *** counseling the patient face to face. The total time spent in the appointment was *** and  more than 50% was on counseling, review of test results, and coordination of care.   Santiago Glad, NP-C @DATE @

## 2023-12-09 ENCOUNTER — Encounter: Payer: Self-pay | Admitting: Nurse Practitioner

## 2023-12-09 ENCOUNTER — Inpatient Hospital Stay: Payer: Federal, State, Local not specified - PPO | Admitting: Nurse Practitioner

## 2023-12-09 VITALS — BP 115/72 | HR 67 | Temp 97.7°F | Resp 16 | Ht 69.0 in | Wt 170.4 lb

## 2023-12-09 DIAGNOSIS — Z9049 Acquired absence of other specified parts of digestive tract: Secondary | ICD-10-CM | POA: Diagnosis not present

## 2023-12-09 DIAGNOSIS — C187 Malignant neoplasm of sigmoid colon: Secondary | ICD-10-CM | POA: Diagnosis not present

## 2023-12-09 DIAGNOSIS — Z85038 Personal history of other malignant neoplasm of large intestine: Secondary | ICD-10-CM | POA: Diagnosis not present

## 2023-12-10 ENCOUNTER — Encounter: Payer: Self-pay | Admitting: Hematology

## 2023-12-11 ENCOUNTER — Encounter: Payer: Self-pay | Admitting: Nurse Practitioner

## 2023-12-11 LAB — GUARDANT REVEAL

## 2023-12-16 DIAGNOSIS — Z3009 Encounter for other general counseling and advice on contraception: Secondary | ICD-10-CM | POA: Diagnosis not present

## 2024-02-28 DIAGNOSIS — Z302 Encounter for sterilization: Secondary | ICD-10-CM | POA: Diagnosis not present

## 2024-06-03 NOTE — Progress Notes (Signed)
 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  Diagnosed 11/2020, s/p sigmoid colectomy by Dr. Gladis.  -No high risk features on path, adjuvant chemo was not recommended.  -He began surveillance and vitamin D.  -Serial ctDNA not detected, most recent 06/24/2023 -Surveillance colonoscopy 01/2022 with tubular adenoma, 3 year recall  -Henry Wall is clinically doing well, exam is benign, labs are normal.  Recent surveillance CT which shows no evidence of recurrence was reviewed with him.  -He remains in clinical remission, now 3 years out from definitive surgery, recurrence risk is decreased -We will follow-up the ctDNA from today and see him back in 6 months, or sooner if needed -repeat CT CAP in 6 months with labs and follow up 1 to 2 weeks after  CT CAP to review.    Cancer of sigmoid colon Patient reports doing very well.  Participates in normal activities.  Has good energy.  Mildly hypoglycemic this morning but labs are otherwise normal.  CEA is normal.  New guardant reveal test for  ctDNA returns for today's visit.  Will follow-up with him via telephone or MyChart message when results have returned.  He is clinically doing well.  Surveillance CT CAP in 6 months.  Plan Labs reviewed. - Normal CBC. - Mild hypercalcemia. - CEA is normal at 1.40 - Guardant reveal test for ctDNA drawn during today's visit.  Expect results in 2 to 3 weeks. Surveillance CT CAP ordered for 6 months. Labs and follow-up in 6 months approximately 1 to 2 weeks after CT to review results.  The patient  understands the plans discussed today and is in agreement with them.  He knows to contact our office if he develops concerns prior to his next appointment.  I provided 25 minutes of face-to-face time during this encounter and > 50% was spent counseling as documented under my assessment and plan.    Powell FORBES Lessen, NP  [facility] - A DEPT OF [facility] [address] [place] [address] Dept: [phone] Dept Fax: [phone]   Orders Placed This Encounter  Procedures   CT CHEST ABDOMEN PELVIS W CONTRAST    12/06/24 sch [facility] Epic WT 168 No Diabetes No allergies to contrast dye No kidney cancer/kidney surgery/yes both kidneys No special needs/No spinal cord stimulator/body  injector/glucose monitor/port/ attached) If you need to cancel your appt, please do so 24 hrs prior to your appointment to avoid getting charged a no show fee of $75 PT is aware/PT verbal understood instructions given Pt aware to p/u contrast-pansy    Standing Status:   Future    Expected Date:   12/06/2024    Expiration Date:   06/08/2025    If indicated for the ordered procedure, I authorize the administration of contrast media per Radiology protocol:   Yes    Does the patient have a contrast media/X-ray dye allergy?:   No    If indicated for the ordered procedure, I authorize the administration of oral contrast media per Radiology protocol:   Yes    Preferred  imaging location?:   [facility]      CHIEF COMPLAINT:  CC: Cancer of sigmoid colon  Current Treatment: Surveillance  INTERVAL HISTORY:  Henry Wall is here today for repeat clinical assessment.  He was last seen by Lacie, NP, on 12/09/2023.  Guardant reveal from 12/02/2023 was negative.  New guardant reveal test drawn during today's visit.  Patient reports feeling very well.  Denies unusual bleeding such as blood in his stool, hematemesis, or hemoptysis.  He denies abdominal  pain.  He has good energy levels and is able to participate in normal activities.  He denies chest pain, chest pressure, or shortness of breath. He denies headaches or visual disturbances. He denies abdominal pain, nausea, vomiting, or changes in bowel or bladder habits.  He denies fevers or chills. He denies pain. His appetite is good. His weight has decreased 2 pounds over last 6 months.  I have reviewed the past medical history, past surgical history, social history and family history with the patient and they are unchanged from previous note.  ALLERGIES:  has no known allergies.  MEDICATIONS:  Current Outpatient Medications  Medication Sig Dispense Refill   Cholecalciferol  1.25 MG (50000 UT) capsule Take 1 capsule (50,000 Units total) by mouth once a week. 12 capsule 1   Melatonin 5 MG CHEW Chew 10 mg by mouth at bedtime.     No current facility-administered medications for this visit.    HISTORY OF PRESENT ILLNESS:   Oncology History Overview Note   Cancer Staging  Cancer of sigmoid colon [facility]) Staging form: Colon and Rectum, AJCC 8th Edition - Pathologic stage from 11/30/2020: Stage IIA (pT3, pN0, cM0) - Signed by Burton, Lacie K, NP on 12/12/2020    Cancer of sigmoid colon (HCC)  11/24/2020 Miscellaneous   FOBT+ in ED - admitted for further work up    11/24/2020 Imaging   CT AP with contrast IMPRESSION: Normal examination.   11/25/2020 Imaging   CTA abdomen/pelvis IMPRESSION: VASCULAR No acute vascular abnormality is noted. No findings to suggest active GI hemorrhage are seen. NON-VASCULAR No acute abnormality noted.  Unchanged from the previous day.   11/26/2020 Procedure   Colonoscopy, Dr. Victory Brand  A fungating and infiltrative partially obstructing mass was found in the distal sigmoid colon, with the distal aspect about 18-20cm from the anal verge when insufflated. The mass was partially circumferential (involving one-half of the lumen circumference), and the adult  colonoscope passed easily. The mass measured about four cm in length. Oozing was present due to friable tissue with scope passage. There was also high grade stiagmata of a visible vessel within the mass that was the source of recent brisk bleeding.   11/26/2020 Tumor Marker   Baseline CEA: 0.9   11/30/2020 Definitive Surgery   Lap assisted sigmoid colectomy by Dr. Donnice Lunger   11/30/2020 Pathology Results   FINAL MICROSCOPIC DIAGNOSIS:  A. COLON, SIGMOID, RESECTION:  -  Adenocarcinoma, moderately differentiated, 3 cm  -  No carcinoma identified in twenty-seven lymph nodes (0/27)  -  Margins uninvolved by carcinoma  Perforation-not identified Lymphovascular invasion-not identified Perineural invasion-not identified PT3PN0 MMR normal, MSI-stable   11/30/2020 Initial Diagnosis   Malignant tumor of colon (HCC)   11/30/2020 Cancer Staging   Staging form: Colon and Rectum, AJCC 8th Edition - Pathologic stage from 11/30/2020: Stage IIA (pT3, pN0, cM0) - Signed by Burton, Lacie K, NP on 12/12/2020 Total positive nodes: 0 Histologic grading system: 4 grade system Histologic grade (G): G2 Laterality:  Left Lymph-vascular invasion (LVI): LVI not present (absent)/not identified Perineural invasion (PNI): Absent Microsatellite instability (MSI): Stable   01/23/2021 Genetic Testing   Negative genetic testing:  No pathogenic variants detected on the [employer] CancerNext-Expanded + RNAinsight panel. Two variants of uncertain significance (VUS) were detected - one in the ALK gene called p.R1212H and a second in the APC gene called 5'UTR_EX4dup. The report date is 01/23/2021.  The CancerNext-Expanded + RNAinsight gene panel offered by [employer] and includes sequencing and rearrangement analysis for the following 77 genes: AIP, ALK, APC, ATM, AXIN2, BAP1, BARD1, BLM, BMPR1A, BRCA1, BRCA2, BRIP1, CDC73, CDH1, CDK4, CDKN1B, CDKN2A, CHEK2, CTNNA1, DICER1, FANCC, FH, FLCN, GALNT12, KIF1B, LZTR1, MAX, MEN1, MET,  MLH1, MSH2, MSH3, MSH6, MUTYH, NBN, NF1, NF2, NTHL1, PALB2, PHOX2B, PMS2, POT1, PRKAR1A, PTCH1, PTEN, RAD51C, RAD51D, RB1, RECQL, RET, SDHA, SDHAF2, SDHB, SDHC, SDHD, SMAD4, SMARCA4, SMARCB1, SMARCE1, STK11, SUFU, TMEM127, TP53, TSC1, TSC2, VHL and XRCC2 (sequencing and deletion/duplication); EGFR, EGLN1, HOXB13, KIT, MITF, PDGFRA, POLD1 and POLE (sequencing only); EPCAM and GREM1 (deletion/duplication only). RNA data is routinely analyzed for use in variant interpretation for all genes.    12/04/2021 Imaging   EXAM: CT CHEST, ABDOMEN, AND PELVIS WITH CONTRAST  IMPRESSION: 1. No evidence of local recurrence or metastatic disease within the chest, abdomen, or pelvis. 2. Large colonic stool burden.       REVIEW OF SYSTEMS:   Constitutional: Denies fevers, chills or abnormal weight loss Eyes: Denies blurriness of vision Ears, nose, mouth, throat, and face: Denies mucositis or sore throat Respiratory: Denies cough, dyspnea or wheezes Cardiovascular: Denies palpitation, chest discomfort or lower extremity swelling Gastrointestinal:  Denies nausea, heartburn or change in bowel habits Skin: Denies abnormal skin rashes Lymphatics: Denies new lymphadenopathy or easy bruising Neurological:Denies numbness, tingling or new weaknesses Behavioral/Psych: Mood is stable, no new changes  All other systems were reviewed with the patient and are negative.   VITALS:   Today's Vitals   06/08/24 0930 06/08/24 0936  BP: 120/84   Pulse: 71   Resp: 17   Temp: 98.7 F (37.1 C)   SpO2: 98%   Weight: 168 lb 9.6 oz (76.5 kg)   PainSc:  0-No pain   Body mass index is 24.9 kg/m.   Wt Readings from Last 3 Encounters:  06/08/24 168 lb 9.6 oz (76.5 kg)  12/09/23 170 lb 6.4 oz (77.3 kg)  11/25/23 170 lb 3.2 oz (77.2 kg)    Body mass index is 24.9 kg/m.  Performance status (ECOG): 0 - Asymptomatic  PHYSICAL EXAM:   GENERAL:alert, no distress and comfortable SKIN: skin color, texture, turgor are  normal, no rashes or significant lesions EYES: normal, Conjunctiva are pink and non-injected, sclera clear OROPHARYNX:no exudate, no erythema and lips, buccal mucosa, and tongue normal  NECK: supple, thyroid normal size, non-tender, without nodularity LYMPH:  no palpable lymphadenopathy in the cervical, axillary or inguinal LUNGS: clear to auscultation and percussion with normal breathing effort HEART: regular rate & rhythm and no murmurs and no lower extremity edema ABDOMEN:abdomen soft, non-tender and normal bowel sounds Musculoskeletal:no cyanosis of digits and no clubbing  NEURO: alert & oriented x 3 with fluent speech, no focal motor/sensory deficits  LABORATORY DATA:  I have reviewed the data as listed    Component Value Date/Time   NA 140 06/08/2024 0902   K 4.6 06/08/2024 0902   CL 105 06/08/2024 0902   CO2 31 06/08/2024 0902   GLUCOSE 64 (L) 06/08/2024 0902   BUN 15 06/08/2024 0902  CREATININE 1.12 06/08/2024 0902   CALCIUM 9.3 06/08/2024 0902   PROT 6.9 06/08/2024 0902   ALBUMIN 4.7 06/08/2024 0902   AST 15 06/08/2024 0902   ALT 16 06/08/2024 0902   ALKPHOS 52 06/08/2024 0902   BILITOT 1.0 06/08/2024 0902   GFRNONAA >60 06/08/2024 0902     Lab Results  Component Value Date   WBC 6.0 06/08/2024   NEUTROABS 4.0 06/08/2024   HGB 15.4 06/08/2024   HCT 44.7 06/08/2024   MCV 93.7 06/08/2024   PLT 163 06/08/2024

## 2024-06-03 NOTE — Assessment & Plan Note (Addendum)
 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  Diagnosed 11/2020, s/p sigmoid colectomy by Dr. Gladis.  -No high risk features on path, adjuvant chemo was not recommended.  -He began surveillance and vitamin D.  -Serial ctDNA not detected, most recent 06/24/2023 -Surveillance colonoscopy 01/2022 with tubular adenoma, 3 year recall  -Henry Wall is clinically doing well, exam is benign, labs are normal.  Recent surveillance CT which shows no evidence of recurrence was reviewed with him.  -He remains in clinical remission, now 3 years out from definitive surgery, recurrence risk is decreased -We will follow-up the ctDNA from today and see him back in 6 months, or sooner if needed -repeat CT CAP in 6 months with labs and follow up 1 to 2 weeks after  CT CAP to review.

## 2024-06-08 ENCOUNTER — Other Ambulatory Visit: Payer: Self-pay

## 2024-06-08 ENCOUNTER — Inpatient Hospital Stay (HOSPITAL_BASED_OUTPATIENT_CLINIC_OR_DEPARTMENT_OTHER): Admitting: Nurse Practitioner

## 2024-06-08 ENCOUNTER — Inpatient Hospital Stay: Attending: Nurse Practitioner

## 2024-06-08 VITALS — BP 120/84 | HR 71 | Temp 98.7°F | Resp 17 | Wt 168.6 lb

## 2024-06-08 DIAGNOSIS — Z9049 Acquired absence of other specified parts of digestive tract: Secondary | ICD-10-CM | POA: Insufficient documentation

## 2024-06-08 DIAGNOSIS — C187 Malignant neoplasm of sigmoid colon: Secondary | ICD-10-CM

## 2024-06-08 LAB — CMP (CANCER CENTER ONLY)
ALT: 16 U/L (ref 0–44)
AST: 15 U/L (ref 15–41)
Albumin: 4.7 g/dL (ref 3.5–5.0)
Alkaline Phosphatase: 52 U/L (ref 38–126)
Anion gap: 4 — ABNORMAL LOW (ref 5–15)
BUN: 15 mg/dL (ref 6–20)
CO2: 31 mmol/L (ref 22–32)
Calcium: 9.3 mg/dL (ref 8.9–10.3)
Chloride: 105 mmol/L (ref 98–111)
Creatinine: 1.12 mg/dL (ref 0.61–1.24)
GFR, Estimated: >60 mL/min (ref >60)
Glucose, Bld: 64 mg/dL — ABNORMAL LOW (ref 70–99)
Potassium: 4.6 mmol/L (ref 3.5–5.1)
Sodium: 140 mmol/L (ref 135–145)
Total Bilirubin: 1 mg/dL (ref 0.0–1.2)
Total Protein: 6.9 g/dL (ref 6.5–8.1)

## 2024-06-08 LAB — CBC WITH DIFFERENTIAL (CANCER CENTER ONLY)
Basophils Absolute: 0.1 K/uL (ref 0.0–0.1)
Basophils Relative: 1 %
Eosinophils Absolute: 0.1 K/uL (ref 0.0–0.5)
Eosinophils Relative: 1 %
HCT: 44.7 % (ref 39.0–52.0)
Hemoglobin: 15.4 g/dL (ref 13.0–17.0)
Lymphocytes Relative: 29 %
Lymphs Abs: 1.7 K/uL (ref 0.7–4.0)
MCH: 32.3 pg (ref 26.0–34.0)
MCHC: 34.5 g/dL (ref 30.0–36.0)
MCV: 93.7 fL (ref 80.0–100.0)
Monocytes Absolute: 0.1 K/uL (ref 0.1–1.0)
Monocytes Relative: 2 %
Neutro Abs: 4 K/uL (ref 1.7–7.7)
Neutrophils Relative %: 67 %
Platelet Count: 163 K/uL (ref 150–400)
RBC: 4.77 MIL/uL (ref 4.22–5.81)
RDW: 11.8 % (ref 11.5–15.5)
WBC Count: 6 K/uL (ref 4.0–10.5)
nRBC: 0 % (ref 0.0–0.2)

## 2024-06-08 LAB — CEA (ACCESS): CEA (CHCC): 1.3 ng/mL (ref 0.00–5.00)

## 2024-06-08 NOTE — Progress Notes (Signed)
 As per Lacie Burton NP, order was placed in [facility] portal, order taken to lab with paperwork to be drawn today.

## 2024-06-11 ENCOUNTER — Encounter: Payer: Self-pay | Admitting: Nurse Practitioner

## 2024-06-15 ENCOUNTER — Encounter: Payer: Self-pay | Admitting: Nurse Practitioner

## 2024-06-16 ENCOUNTER — Encounter: Payer: Self-pay | Admitting: Hematology

## 2024-06-18 LAB — GUARDANT REVEAL

## 2024-11-19 ENCOUNTER — Other Ambulatory Visit

## 2024-11-30 ENCOUNTER — Other Ambulatory Visit

## 2024-12-07 ENCOUNTER — Ambulatory Visit: Admitting: Hematology
